# Patient Record
Sex: Female | Born: 1940 | Race: White | Hispanic: No | Smoking: Never smoker
Health system: Southern US, Community
[De-identification: ages and names within clinical notes are randomized; demographics above are authoritative.]

## PROBLEM LIST (undated history)

## (undated) DIAGNOSIS — E119 Type 2 diabetes mellitus without complications: Secondary | ICD-10-CM

## (undated) DIAGNOSIS — E785 Hyperlipidemia, unspecified: Secondary | ICD-10-CM

## (undated) DIAGNOSIS — E079 Disorder of thyroid, unspecified: Secondary | ICD-10-CM

## (undated) DIAGNOSIS — K219 Gastro-esophageal reflux disease without esophagitis: Secondary | ICD-10-CM

## (undated) DIAGNOSIS — I10 Essential (primary) hypertension: Secondary | ICD-10-CM

## (undated) HISTORY — PX: BRAIN SURGERY: SHX531

## (undated) HISTORY — PX: THYROIDECTOMY: SHX17

## (undated) HISTORY — PX: CHOLECYSTECTOMY: SHX55

## (undated) HISTORY — PX: ABDOMINAL HYSTERECTOMY: SHX81

---

## 2014-07-03 ENCOUNTER — Emergency Department
Admit: 2014-07-03 | Disposition: A | Discharge status: Home / Self Care | Payer: Self-pay | Admitting: Emergency Medicine

## 2014-07-03 LAB — BASIC METABOLIC PANEL
ANION GAP: 11 (ref 7–16)
BUN: 19 mg/dL
Calcium, Total: 8.6 mg/dL — ABNORMAL LOW
Chloride: 101 mmol/L
Co2: 21 mmol/L — ABNORMAL LOW
Creatinine: 1.14 mg/dL — ABNORMAL HIGH
EGFR (African American): 56 — ABNORMAL LOW
GFR CALC NON AF AMER: 48 — AB
GLUCOSE: 234 mg/dL — AB
Potassium: 3.7 mmol/L
Sodium: 133 mmol/L — ABNORMAL LOW

## 2014-07-03 LAB — CBC
HCT: 32.9 % — AB (ref 35.0–47.0)
HGB: 10.3 g/dL — ABNORMAL LOW (ref 12.0–16.0)
MCH: 23.3 pg — AB (ref 26.0–34.0)
MCHC: 31.2 g/dL — ABNORMAL LOW (ref 32.0–36.0)
MCV: 74 fL — ABNORMAL LOW (ref 80–100)
Platelet: 196 10*3/uL (ref 150–440)
RBC: 4.42 10*6/uL (ref 3.80–5.20)
RDW: 16.5 % — ABNORMAL HIGH (ref 11.5–14.5)
WBC: 13.6 10*3/uL — ABNORMAL HIGH (ref 3.6–11.0)

## 2014-07-03 LAB — TROPONIN I: Troponin-I: <0.03 ng/mL

## 2016-08-15 ENCOUNTER — Emergency Department
Admission: EM | Admit: 2016-08-15 | Discharge: 2016-08-15 | Disposition: A | Discharge status: Home / Self Care | Payer: Medicare Other | Attending: Emergency Medicine | Admitting: Emergency Medicine

## 2016-08-15 ENCOUNTER — Emergency Department: Payer: Medicare Other

## 2016-08-15 DIAGNOSIS — Y929 Unspecified place or not applicable: Secondary | ICD-10-CM | POA: Diagnosis not present

## 2016-08-15 DIAGNOSIS — X501XXA Overexertion from prolonged static or awkward postures, initial encounter: Secondary | ICD-10-CM | POA: Diagnosis not present

## 2016-08-15 DIAGNOSIS — M25562 Pain in left knee: Secondary | ICD-10-CM | POA: Insufficient documentation

## 2016-08-15 DIAGNOSIS — Y999 Unspecified external cause status: Secondary | ICD-10-CM | POA: Diagnosis not present

## 2016-08-15 DIAGNOSIS — S8992XA Unspecified injury of left lower leg, initial encounter: Secondary | ICD-10-CM | POA: Diagnosis present

## 2016-08-15 DIAGNOSIS — Y9389 Activity, other specified: Secondary | ICD-10-CM | POA: Diagnosis not present

## 2016-08-15 MED ORDER — MELOXICAM 7.5 MG PO TABS
7.5000 mg | ORAL_TABLET | Freq: Every day | ORAL | 1 refills | Status: AC
Start: 2016-08-15 — End: 2016-08-22

## 2016-08-15 MED ORDER — MELOXICAM 7.5 MG PO TABS
7.5000 mg | ORAL_TABLET | Freq: Every day | ORAL | Status: DC
  Administered 2016-08-15: 7.5 mg via ORAL
  Filled 2016-08-15: qty 1

## 2016-08-15 NOTE — ED Notes (Signed)
Left knee swollen medial. Pain 10/10. +2 pulses.

## 2016-08-15 NOTE — ED Provider Notes (Signed)
[facility] Emergency Department Provider Note  ____________________________________________  Time seen: Approximately 5:35 PM  I have reviewed the triage vital signs and the nursing notes.   HISTORY  Chief Complaint Knee Pain    HPI Kaitlyn Lewis is a 76 y.o. female presenting to the emergency department with 10 out of 10 popping left knee pain. Patient states that she was attempting to get in her car yesterday when her left knee twisted and she heard a "pop". Patient denies a history of chronic left knee pain. No falls occurred during the incident. Patient states that since the incident, her left knee has felt unstable. Patient denies radiculopathy and weakness. No prior surgeries to the left knee. No alleviating measures have been attempted.   No past medical history on file.  There are no active problems to display for this patient.   No past surgical history on file.  Prior to Admission medications   Medication Sig Start Date End Date Taking? Authorizing Provider  meloxicam (MOBIC) 7.5 MG tablet Take 1 tablet (7.5 mg total) by mouth daily. 08/15/16 08/22/16  Orvil FeilWoods, Jaclyn M, PA-C    Allergies Patient has no known allergies.  No family history on file.  Social History Social History  Substance Use Topics  . Smoking status: Not on file  . Smokeless tobacco: Not on file  . Alcohol use Not on file     Review of Systems  Constitutional: No fever/chills Eyes: No visual changes. No discharge ENT: No upper respiratory complaints. Cardiovascular: no chest pain. Respiratory: no cough. No SOB. Musculoskeletal: Patient has left knee pain.  Skin: Patient has mild edema of left knee. Neurological: Negative for headaches, focal weakness or numbness.   ____________________________________________   PHYSICAL EXAM:  VITAL SIGNS: ED Triage Vitals  Enc Vitals Group     BP 08/15/16 1700 (!) 156/65     Pulse Rate 08/15/16 1700 100     Resp  08/15/16 1700 18     Temp 08/15/16 1700 98.3 F (36.8 C)     Temp Source 08/15/16 1700 Oral     SpO2 08/15/16 1700 96 %     Weight 08/15/16 1658 165 lb (74.8 kg)     Height 08/15/16 1658 5\' 2"  (1.575 m)     Head Circumference --      Peak Flow --      Pain Score 08/15/16 1658 10     Pain Loc --      Pain Edu? --      Excl. in GC? --      Constitutional: Alert and oriented. Well appearing and in no acute distress. Eyes: Conjunctivae are normal. PERRL. EOMI. Head: Atraumatic. Cardiovascular: Normal rate, regular rhythm. Normal S1 and S2.  Good peripheral circulation. Respiratory: Normal respiratory effort without tachypnea or retractions. Lungs CTAB. Good air entry to the bases with no decreased or absent breath sounds. Musculoskeletal: Patient has 5 out of 5 strength in lower extremities bilaterally. Left knee: Negative anterior and posterior drawer test. No laxity with MCL or LCL testing. Patient has pain with palpation over the medial joint line. Positive McMurray's. Palpable dorsalis pedis pulse bilaterally and symmetrically. Neurologic:  Normal speech and language. No gross focal neurologic deficits are appreciated.  Skin: Patient has mild edema of the skin overlying the left knee. Psychiatric: Mood and affect are normal. Speech and behavior are normal. Patient exhibits appropriate insight and judgement.   ____________________________________________   LABS (all labs ordered are listed, but only abnormal  results are displayed)  Labs Reviewed - No data to display ____________________________________________  EKG   ____________________________________________  RADIOLOGY Geraldo Pitter, personally viewed and evaluated these images (plain radiographs) as part of my medical decision making, as well as reviewing the written report by the radiologist.  DG Left Knee: Negative left knee radiographs   No results  found.  ____________________________________________    PROCEDURES  Procedure(s) performed:    Procedures    Medications  meloxicam (MOBIC) tablet 7.5 mg (7.5 mg Oral Given 08/15/16 1750)     ____________________________________________   INITIAL IMPRESSION / ASSESSMENT AND PLAN / ED COURSE  Pertinent labs & imaging results that were available during my care of the patient were reviewed by me and considered in my medical decision making (see chart for details).  Review of the [place] CSRS was performed in accordance of the [facility] prior to dispensing any controlled drugs.     Assessment and Plan:  Left Knee Pain: Patient presents to the emergency department with left knee pain after a twisting injury while getting into a car. DG left knee reveals no acute fractures or bony abnormalities. Patient was given Mobic in the emergency department and discharged with Mobic. A referral was given to the orthopedics, Dr. Martha Clan. Vital signs were reassuring prior to discharge. All patient questions were answered.  ____________________________________________  FINAL CLINICAL IMPRESSION(S) / ED DIAGNOSES  Final diagnoses:  Acute pain of left knee      NEW MEDICATIONS STARTED DURING THIS VISIT:  New Prescriptions   MELOXICAM (MOBIC) 7.5 MG TABLET    Take 1 tablet (7.5 mg total) by mouth daily.        This chart was dictated using voice recognition software/Dragon. Despite best efforts to proofread, errors can occur which can change the meaning. Any change was purely unintentional.    Gasper Lloyd 08/15/16 Caro Hight, MD 08/15/16 2116

## 2016-08-15 NOTE — ED Triage Notes (Signed)
Pt reports yesterday she was getting into her car and twisted her left knee.

## 2017-07-11 ENCOUNTER — Emergency Department: Payer: Medicare Other

## 2017-07-11 ENCOUNTER — Inpatient Hospital Stay
Admission: EM | Admit: 2017-07-11 | Discharge: 2017-07-14 | DRG: 395 | Disposition: A | Discharge status: Home / Self Care | Payer: Medicare Other | Attending: Specialist | Admitting: Specialist

## 2017-07-11 ENCOUNTER — Other Ambulatory Visit: Payer: Self-pay

## 2017-07-11 DIAGNOSIS — N289 Disorder of kidney and ureter, unspecified: Secondary | ICD-10-CM | POA: Diagnosis not present

## 2017-07-11 DIAGNOSIS — Z79899 Other long term (current) drug therapy: Secondary | ICD-10-CM

## 2017-07-11 DIAGNOSIS — K219 Gastro-esophageal reflux disease without esophagitis: Secondary | ICD-10-CM | POA: Diagnosis not present

## 2017-07-11 DIAGNOSIS — E119 Type 2 diabetes mellitus without complications: Secondary | ICD-10-CM

## 2017-07-11 DIAGNOSIS — K921 Melena: Secondary | ICD-10-CM | POA: Diagnosis not present

## 2017-07-11 DIAGNOSIS — E785 Hyperlipidemia, unspecified: Secondary | ICD-10-CM | POA: Diagnosis not present

## 2017-07-11 DIAGNOSIS — I10 Essential (primary) hypertension: Secondary | ICD-10-CM | POA: Diagnosis present

## 2017-07-11 DIAGNOSIS — F329 Major depressive disorder, single episode, unspecified: Secondary | ICD-10-CM | POA: Diagnosis present

## 2017-07-11 DIAGNOSIS — Z9049 Acquired absence of other specified parts of digestive tract: Secondary | ICD-10-CM

## 2017-07-11 DIAGNOSIS — E890 Postprocedural hypothyroidism: Secondary | ICD-10-CM | POA: Diagnosis present

## 2017-07-11 DIAGNOSIS — K5730 Diverticulosis of large intestine without perforation or abscess without bleeding: Secondary | ICD-10-CM | POA: Diagnosis present

## 2017-07-11 DIAGNOSIS — K449 Diaphragmatic hernia without obstruction or gangrene: Secondary | ICD-10-CM | POA: Diagnosis present

## 2017-07-11 DIAGNOSIS — Z90710 Acquired absence of both cervix and uterus: Secondary | ICD-10-CM

## 2017-07-11 DIAGNOSIS — Z79890 Hormone replacement therapy (postmenopausal): Secondary | ICD-10-CM | POA: Diagnosis not present

## 2017-07-11 DIAGNOSIS — Z8249 Family history of ischemic heart disease and other diseases of the circulatory system: Secondary | ICD-10-CM

## 2017-07-11 DIAGNOSIS — Z7982 Long term (current) use of aspirin: Secondary | ICD-10-CM | POA: Diagnosis not present

## 2017-07-11 DIAGNOSIS — Z7984 Long term (current) use of oral hypoglycemic drugs: Secondary | ICD-10-CM | POA: Diagnosis not present

## 2017-07-11 DIAGNOSIS — E1140 Type 2 diabetes mellitus with diabetic neuropathy, unspecified: Secondary | ICD-10-CM | POA: Diagnosis not present

## 2017-07-11 DIAGNOSIS — D500 Iron deficiency anemia secondary to blood loss (chronic): Secondary | ICD-10-CM | POA: Diagnosis present

## 2017-07-11 DIAGNOSIS — Z794 Long term (current) use of insulin: Secondary | ICD-10-CM

## 2017-07-11 DIAGNOSIS — K55039 Acute (reversible) ischemia of large intestine, extent unspecified: Secondary | ICD-10-CM | POA: Diagnosis present

## 2017-07-11 DIAGNOSIS — K529 Noninfective gastroenteritis and colitis, unspecified: Secondary | ICD-10-CM | POA: Diagnosis present

## 2017-07-11 DIAGNOSIS — K5732 Diverticulitis of large intestine without perforation or abscess without bleeding: Secondary | ICD-10-CM | POA: Diagnosis not present

## 2017-07-11 HISTORY — DX: Gastro-esophageal reflux disease without esophagitis: K21.9

## 2017-07-11 HISTORY — DX: Essential (primary) hypertension: I10

## 2017-07-11 HISTORY — DX: Disorder of thyroid, unspecified: E07.9

## 2017-07-11 HISTORY — DX: Hyperlipidemia, unspecified: E78.5

## 2017-07-11 HISTORY — DX: Type 2 diabetes mellitus without complications: E11.9

## 2017-07-11 LAB — GASTROINTESTINAL PANEL BY PCR, STOOL (REPLACES STOOL CULTURE)
Adenovirus F40/41: NOT DETECTED
Astrovirus: NOT DETECTED
CAMPYLOBACTER SPECIES: NOT DETECTED
CRYPTOSPORIDIUM: NOT DETECTED
CYCLOSPORA CAYETANENSIS: NOT DETECTED
ENTEROAGGREGATIVE E COLI (EAEC): NOT DETECTED
Entamoeba histolytica: NOT DETECTED
Enteropathogenic E coli (EPEC): NOT DETECTED
Enterotoxigenic E coli (ETEC): NOT DETECTED
GIARDIA LAMBLIA: NOT DETECTED
Norovirus GI/GII: NOT DETECTED
Plesimonas shigelloides: NOT DETECTED
Rotavirus A: NOT DETECTED
Salmonella species: NOT DETECTED
Sapovirus (I, II, IV, and V): NOT DETECTED
Shiga like toxin producing E coli (STEC): NOT DETECTED
Shigella/Enteroinvasive E coli (EIEC): NOT DETECTED
VIBRIO SPECIES: NOT DETECTED
Vibrio cholerae: NOT DETECTED
YERSINIA ENTEROCOLITICA: NOT DETECTED

## 2017-07-11 LAB — URINALYSIS, COMPLETE (UACMP) WITH MICROSCOPIC
Bacteria, UA: NONE SEEN
Bilirubin Urine: NEGATIVE
Glucose, UA: 150 mg/dL — AB
Hgb urine dipstick: NEGATIVE
Ketones, ur: NEGATIVE mg/dL
Leukocytes, UA: NEGATIVE
Nitrite: NEGATIVE
Protein, ur: 30 mg/dL — AB
SPECIFIC GRAVITY, URINE: 1.017 (ref 1.005–1.030)
pH: 5 (ref 5.0–8.0)

## 2017-07-11 LAB — COMPREHENSIVE METABOLIC PANEL
ALK PHOS: 92 U/L (ref 38–126)
ALT: 19 U/L (ref 14–54)
AST: 35 U/L (ref 15–41)
Albumin: 3.9 g/dL (ref 3.5–5.0)
Anion gap: 10 (ref 5–15)
BUN: 29 mg/dL — ABNORMAL HIGH (ref 6–20)
CALCIUM: 8.7 mg/dL — AB (ref 8.9–10.3)
CO2: 24 mmol/L (ref 22–32)
CREATININE: 1.29 mg/dL — AB (ref 0.44–1.00)
Chloride: 97 mmol/L — ABNORMAL LOW (ref 101–111)
GFR calc non Af Amer: 39 mL/min — ABNORMAL LOW (ref >60)
GFR, EST AFRICAN AMERICAN: 46 mL/min — AB (ref >60)
Glucose, Bld: 240 mg/dL — ABNORMAL HIGH (ref 65–99)
Potassium: 4 mmol/L (ref 3.5–5.1)
Sodium: 131 mmol/L — ABNORMAL LOW (ref 135–145)
Total Bilirubin: 0.9 mg/dL (ref 0.3–1.2)
Total Protein: 7.3 g/dL (ref 6.5–8.1)

## 2017-07-11 LAB — CBC WITH DIFFERENTIAL/PLATELET
Basophils Absolute: 0 10*3/uL (ref 0–0.1)
Basophils Relative: 0 %
EOS ABS: 0 10*3/uL (ref 0–0.7)
EOS PCT: 0 %
HCT: 33.1 % — ABNORMAL LOW (ref 35.0–47.0)
Hemoglobin: 10.8 g/dL — ABNORMAL LOW (ref 12.0–16.0)
LYMPHS ABS: 1.1 10*3/uL (ref 1.0–3.6)
Lymphocytes Relative: 9 %
MCH: 25.5 pg — AB (ref 26.0–34.0)
MCHC: 32.7 g/dL (ref 32.0–36.0)
MCV: 77.8 fL — AB (ref 80.0–100.0)
Monocytes Absolute: 1 10*3/uL — ABNORMAL HIGH (ref 0.2–0.9)
Monocytes Relative: 8 %
Neutro Abs: 10.4 10*3/uL — ABNORMAL HIGH (ref 1.4–6.5)
Neutrophils Relative %: 83 %
PLATELETS: 214 10*3/uL (ref 150–440)
RBC: 4.26 MIL/uL (ref 3.80–5.20)
RDW: 17.1 % — ABNORMAL HIGH (ref 11.5–14.5)
WBC: 12.5 10*3/uL — ABNORMAL HIGH (ref 3.6–11.0)

## 2017-07-11 LAB — HEMOGLOBIN A1C
HEMOGLOBIN A1C: 7.8 % — AB (ref 4.8–5.6)
MEAN PLASMA GLUCOSE: 177.16 mg/dL

## 2017-07-11 LAB — TYPE AND SCREEN
ABO/RH(D): A NEG
Antibody Screen: NEGATIVE

## 2017-07-11 LAB — PROTIME-INR
INR: 1.08
Prothrombin Time: 13.9 seconds (ref 11.4–15.2)

## 2017-07-11 LAB — GLUCOSE, CAPILLARY
GLUCOSE-CAPILLARY: 267 mg/dL — AB (ref 65–99)
Glucose-Capillary: 180 mg/dL — ABNORMAL HIGH (ref 65–99)
Glucose-Capillary: 200 mg/dL — ABNORMAL HIGH (ref 65–99)

## 2017-07-11 LAB — HEMOGLOBIN
HEMOGLOBIN: 10.3 g/dL — AB (ref 12.0–16.0)
Hemoglobin: 10.5 g/dL — ABNORMAL LOW (ref 12.0–16.0)

## 2017-07-11 LAB — C DIFFICILE QUICK SCREEN W PCR REFLEX
C DIFFICILE (CDIFF) INTERP: NOT DETECTED
C DIFFICLE (CDIFF) ANTIGEN: NEGATIVE
C Diff toxin: NEGATIVE

## 2017-07-11 LAB — TROPONIN I

## 2017-07-11 LAB — LIPASE, BLOOD: Lipase: 25 U/L (ref 11–51)

## 2017-07-11 LAB — LACTIC ACID, PLASMA: Lactic Acid, Venous: 2.1 mmol/L (ref 0.5–1.9)

## 2017-07-11 MED ORDER — METRONIDAZOLE IN NACL 5-0.79 MG/ML-% IV SOLN
500.0000 mg | Freq: Once | INTRAVENOUS | Status: AC
  Administered 2017-07-11: 500 mg via INTRAVENOUS
  Filled 2017-07-11: qty 100

## 2017-07-11 MED ORDER — SODIUM CHLORIDE 0.9 % IV SOLN: Freq: Once | INTRAVENOUS | Status: DC

## 2017-07-11 MED ORDER — METRONIDAZOLE IN NACL 5-0.79 MG/ML-% IV SOLN
500.0000 mg | Freq: Three times a day (TID) | INTRAVENOUS | Status: DC
  Administered 2017-07-11 – 2017-07-14 (×8): 500 mg via INTRAVENOUS
  Filled 2017-07-11 (×12): qty 100

## 2017-07-11 MED ORDER — LEVOTHYROXINE SODIUM 88 MCG PO TABS
88.0000 ug | ORAL_TABLET | Freq: Every day | ORAL | Status: DC
  Administered 2017-07-12 – 2017-07-14 (×3): 88 ug via ORAL
  Filled 2017-07-11 (×3): qty 1

## 2017-07-11 MED ORDER — MORPHINE SULFATE (PF) 2 MG/ML IV SOLN
2.0000 mg | INTRAVENOUS | Status: DC | PRN
  Administered 2017-07-11 – 2017-07-13 (×7): 2 mg via INTRAVENOUS
  Filled 2017-07-11 (×7): qty 1

## 2017-07-11 MED ORDER — ACETAMINOPHEN 650 MG RE SUPP: 650.0000 mg | Freq: Four times a day (QID) | RECTAL | Status: DC | PRN

## 2017-07-11 MED ORDER — GABAPENTIN 300 MG PO CAPS
300.0000 mg | ORAL_CAPSULE | Freq: Three times a day (TID) | ORAL | Status: DC
  Administered 2017-07-11 – 2017-07-14 (×9): 300 mg via ORAL
  Filled 2017-07-11 (×10): qty 1

## 2017-07-11 MED ORDER — SODIUM CHLORIDE 0.9 % IV SOLN
Freq: Once | INTRAVENOUS | Status: AC
  Administered 2017-07-11: 09:00:00 via INTRAVENOUS

## 2017-07-11 MED ORDER — ONDANSETRON HCL 4 MG/2ML IJ SOLN
4.0000 mg | Freq: Four times a day (QID) | INTRAMUSCULAR | Status: DC | PRN
  Administered 2017-07-12 – 2017-07-13 (×2): 4 mg via INTRAVENOUS
  Filled 2017-07-11 (×2): qty 2

## 2017-07-11 MED ORDER — AMITRIPTYLINE HCL 50 MG PO TABS
50.0000 mg | ORAL_TABLET | Freq: Every day | ORAL | Status: DC
  Administered 2017-07-11 – 2017-07-13 (×3): 50 mg via ORAL
  Filled 2017-07-11 (×4): qty 1

## 2017-07-11 MED ORDER — PROPRANOLOL HCL 20 MG PO TABS
20.0000 mg | ORAL_TABLET | Freq: Two times a day (BID) | ORAL | Status: DC
  Filled 2017-07-11: qty 1

## 2017-07-11 MED ORDER — MORPHINE SULFATE (PF) 4 MG/ML IV SOLN
4.0000 mg | Freq: Once | INTRAVENOUS | Status: AC
  Administered 2017-07-11: 4 mg via INTRAVENOUS
  Filled 2017-07-11: qty 1

## 2017-07-11 MED ORDER — CIPROFLOXACIN IN D5W 400 MG/200ML IV SOLN
400.0000 mg | Freq: Once | INTRAVENOUS | Status: AC
  Administered 2017-07-11: 400 mg via INTRAVENOUS
  Filled 2017-07-11: qty 200

## 2017-07-11 MED ORDER — INSULIN GLARGINE 100 UNIT/ML ~~LOC~~ SOLN
20.0000 [IU] | Freq: Two times a day (BID) | SUBCUTANEOUS | Status: DC
  Administered 2017-07-11 – 2017-07-13 (×5): 20 [IU] via SUBCUTANEOUS
  Filled 2017-07-11 (×8): qty 0.2

## 2017-07-11 MED ORDER — INSULIN ASPART 100 UNIT/ML ~~LOC~~ SOLN
0.0000 [IU] | Freq: Every day | SUBCUTANEOUS | Status: DC
  Administered 2017-07-11: 3 [IU] via SUBCUTANEOUS
  Filled 2017-07-11: qty 1

## 2017-07-11 MED ORDER — PANTOPRAZOLE SODIUM 40 MG PO TBEC
40.0000 mg | DELAYED_RELEASE_TABLET | Freq: Every day | ORAL | Status: DC
  Administered 2017-07-11 – 2017-07-14 (×4): 40 mg via ORAL
  Filled 2017-07-11 (×4): qty 1

## 2017-07-11 MED ORDER — MORPHINE SULFATE (PF) 4 MG/ML IV SOLN
INTRAVENOUS | Status: AC
  Filled 2017-07-11: qty 1

## 2017-07-11 MED ORDER — CIPROFLOXACIN IN D5W 400 MG/200ML IV SOLN
400.0000 mg | Freq: Two times a day (BID) | INTRAVENOUS | Status: DC
  Administered 2017-07-11 – 2017-07-14 (×6): 400 mg via INTRAVENOUS
  Filled 2017-07-11 (×8): qty 200

## 2017-07-11 MED ORDER — TRAMADOL HCL 50 MG PO TABS
50.0000 mg | ORAL_TABLET | Freq: Four times a day (QID) | ORAL | Status: DC | PRN
  Administered 2017-07-11 – 2017-07-12 (×4): 50 mg via ORAL
  Filled 2017-07-11 (×5): qty 1

## 2017-07-11 MED ORDER — ACETAMINOPHEN 325 MG PO TABS: 650.0000 mg | ORAL_TABLET | Freq: Four times a day (QID) | ORAL | Status: DC | PRN

## 2017-07-11 MED ORDER — ATORVASTATIN CALCIUM 20 MG PO TABS
40.0000 mg | ORAL_TABLET | Freq: Every day | ORAL | Status: DC
  Administered 2017-07-11 – 2017-07-14 (×4): 40 mg via ORAL
  Filled 2017-07-11 (×4): qty 2

## 2017-07-11 MED ORDER — SODIUM CHLORIDE 0.9 % IV SOLN
INTRAVENOUS | Status: DC
  Administered 2017-07-11 – 2017-07-13 (×2): via INTRAVENOUS

## 2017-07-11 MED ORDER — INSULIN ASPART 100 UNIT/ML ~~LOC~~ SOLN
0.0000 [IU] | Freq: Three times a day (TID) | SUBCUTANEOUS | Status: DC
  Administered 2017-07-11 – 2017-07-12 (×2): 2 [IU] via SUBCUTANEOUS
  Administered 2017-07-12 (×2): 3 [IU] via SUBCUTANEOUS
  Administered 2017-07-13 (×2): 1 [IU] via SUBCUTANEOUS
  Filled 2017-07-11 (×6): qty 1

## 2017-07-11 MED ORDER — MORPHINE SULFATE (PF) 4 MG/ML IV SOLN
4.0000 mg | Freq: Once | INTRAVENOUS | Status: AC
  Administered 2017-07-11: 4 mg via INTRAVENOUS

## 2017-07-11 MED ORDER — VENLAFAXINE HCL ER 75 MG PO CP24
75.0000 mg | ORAL_CAPSULE | Freq: Every day | ORAL | Status: DC
  Administered 2017-07-12 – 2017-07-14 (×3): 75 mg via ORAL
  Filled 2017-07-11 (×3): qty 1

## 2017-07-11 MED ORDER — PROPRANOLOL HCL 20 MG PO TABS
20.0000 mg | ORAL_TABLET | Freq: Two times a day (BID) | ORAL | Status: DC
  Administered 2017-07-11 – 2017-07-14 (×6): 20 mg via ORAL
  Filled 2017-07-11 (×8): qty 1

## 2017-07-11 MED ORDER — ONDANSETRON HCL 4 MG/2ML IJ SOLN
4.0000 mg | Freq: Once | INTRAMUSCULAR | Status: AC
  Administered 2017-07-11: 4 mg via INTRAVENOUS
  Filled 2017-07-11: qty 2

## 2017-07-11 MED ORDER — IOPAMIDOL (ISOVUE-300) INJECTION 61%
100.0000 mL | Freq: Once | INTRAVENOUS | Status: AC | PRN
  Administered 2017-07-11: 75 mL via INTRAVENOUS

## 2017-07-11 MED ORDER — ONDANSETRON HCL 4 MG PO TABS: 4.0000 mg | ORAL_TABLET | Freq: Four times a day (QID) | ORAL | Status: DC | PRN

## 2017-07-11 NOTE — ED Notes (Signed)
Pt unable to give urine or stool specimen at this time. Attempted x2.

## 2017-07-11 NOTE — ED Notes (Signed)
Pt assisted to toilet to void 

## 2017-07-11 NOTE — ED Triage Notes (Signed)
Pt c/o having nothing but blooding stools yesterday and today, x3 today with abd pain. N/v last night

## 2017-07-11 NOTE — ED Notes (Signed)
Urine specimen obtained and sent to lab. No stool sample at this time.

## 2017-07-11 NOTE — Consult Note (Signed)
Pharmacy Antibiotic Note  Kaitlyn PomfretMary Janet Lewis is a 77 y.o. female admitted on 07/11/2017 with colitis.  Pharmacy has been consulted for ciprofloxacin dosing.  Plan: Cipro 400mg  IV q 12hr  Height: 5\' 5"  (165.1 cm) Weight: 174 lb (78.9 kg) IBW/kg (Calculated) : 57  Temp (24hrs), Avg:98.9 F (37.2 C), Min:98.9 F (37.2 C), Max:98.9 F (37.2 C)  Recent Labs  Lab 07/11/17 0911 07/11/17 1142  WBC 12.5*  --   CREATININE 1.29*  --   LATICACIDVEN  --  2.1*    Estimated Creatinine Clearance: 39.1 mL/min (A) (by C-G formula based on SCr of 1.29 mg/dL (H)).    No Known Allergies  Antimicrobials this admission: cipro 4/18 >>  metronidazole 4/18 >>   Dose adjustments this admission:   Microbiology results: 4/18 GI panel: 4/18 Cdiff :  Thank you for allowing pharmacy to be a part of this patient's care.  Olene FlossMelissa D Elbridge Magowan, Pharm.D, BCPS Clinical Pharmacist  07/11/2017 1:43 PM

## 2017-07-11 NOTE — ED Provider Notes (Signed)
[facility] Emergency Department Provider Note       Time seen: ----------------------------------------- 8:52 AM on 07/11/2017 -----------------------------------------   I have reviewed the triage vital signs and the nursing notes.  HISTORY   Chief Complaint Rectal Bleeding    HPI Kaitlyn Lewis is a 77 y.o. female with a history of diabetes, GERD, hyperlipidemia and hypertension who presents to the ED for rectal bleeding yesterday and today.  She has had 3 episodes today with abdominal pain, nausea vomiting last night.  Patient has had frank watery stools as well as bright blood per rectum.  She has had some lower abdominal pain as well, she has never had these symptoms before.  Again she has had episodes of vomiting as dictated above.  Past Medical History:  Diagnosis Date  . Diabetes mellitus without complication (HCC)   . GERD (gastroesophageal reflux disease)   . Hyperlipemia   . Hypertension   . Thyroid disease     There are no active problems to display for this patient.   Past Surgical History:  Procedure Laterality Date  . ABDOMINAL HYSTERECTOMY    . BRAIN SURGERY     benign tumor removal  . CHOLECYSTECTOMY    . THYROIDECTOMY      Allergies Patient has no known allergies.  Social History Social History   Tobacco Use  . Smoking status: Never Smoker  . Smokeless tobacco: Never Used  Substance Use Topics  . Alcohol use: Never    Frequency: Never  . Drug use: Not on file   Review of Systems Constitutional: Negative for fever. Cardiovascular: Negative for chest pain. Respiratory: Negative for shortness of breath. Gastrointestinal: Positive for abdominal pain, vomiting and diarrhea, rectal bleeding Genitourinary: Negative for dysuria. Musculoskeletal: Negative for back pain. Skin: Negative for rash. Neurological: Negative for headaches, focal weakness or numbness.  All systems negative/normal/unremarkable except as  stated in the HPI  ____________________________________________   PHYSICAL EXAM:  VITAL SIGNS: ED Triage Vitals  Enc Vitals Group     BP 07/11/17 0847 (!) 176/90     Pulse Rate 07/11/17 0847 (!) 116     Resp 07/11/17 0847 18     Temp 07/11/17 0847 98.9 F (37.2 C)     Temp Source 07/11/17 0847 Oral     SpO2 07/11/17 0847 97 %     Weight 07/11/17 0848 174 lb (78.9 kg)     Height 07/11/17 0848 5\' 5"  (1.651 m)     Head Circumference --      Peak Flow --      Pain Score 07/11/17 0848 10     Pain Loc --      Pain Edu? --      Excl. in GC? --    Constitutional: Alert and oriented. Well appearing and in no distress. Eyes: Conjunctivae are normal. Normal extraocular movements. ENT   Head: Normocephalic and atraumatic.   Nose: No congestion/rhinnorhea.   Mouth/Throat: Mucous membranes are moist.   Neck: No stridor. Cardiovascular: Rapid rate, regular rhythm. No murmurs, rubs, or gallops. Respiratory: Normal respiratory effort without tachypnea nor retractions. Breath sounds are clear and equal bilaterally. No wheezes/rales/rhonchi. Gastrointestinal: Nonfocal tenderness, no rebound or guarding.  Normal bowel sounds. Musculoskeletal: Nontender with normal range of motion in extremities. No lower extremity tenderness nor edema. Neurologic:  Normal speech and language. No gross focal neurologic deficits are appreciated.  Skin:  Skin is warm, dry and intact. No rash noted. Psychiatric: Mood and affect are normal. Speech  and behavior are normal.  ____________________________________________  ED COURSE:  As part of my medical decision making, I reviewed the following data within the electronic MEDICAL RECORD NUMBER History obtained from family if available, nursing notes, old chart and ekg, as well as notes from prior ED visits. Patient presented for rectal bleeding, vomiting diarrhea, we will assess with labs and imaging as indicated at this time.    Procedures ____________________________________________   LABS (pertinent positives/negatives)  Labs Reviewed  CBC WITH DIFFERENTIAL/PLATELET - Abnormal; Notable for the following components:      Result Value   WBC 12.5 (*)    Hemoglobin 10.8 (*)    HCT 33.1 (*)    MCV 77.8 (*)    MCH 25.5 (*)    RDW 17.1 (*)    Neutro Abs 10.4 (*)    Monocytes Absolute 1.0 (*)    All other components within normal limits  COMPREHENSIVE METABOLIC PANEL - Abnormal; Notable for the following components:   Sodium 131 (*)    Chloride 97 (*)    Glucose, Bld 240 (*)    BUN 29 (*)    Creatinine, Ser 1.29 (*)    Calcium 8.7 (*)    GFR calc non Af Amer 39 (*)    GFR calc Af Amer 46 (*)    All other components within normal limits  URINALYSIS, COMPLETE (UACMP) WITH MICROSCOPIC - Abnormal; Notable for the following components:   Color, Urine YELLOW (*)    APPearance CLEAR (*)    Glucose, UA 150 (*)    Protein, ur 30 (*)    Squamous Epithelial / LPF 0-5 (*)    All other components within normal limits  C DIFFICILE QUICK SCREEN W PCR REFLEX  GASTROINTESTINAL PANEL BY PCR, STOOL (REPLACES STOOL CULTURE)  LIPASE, BLOOD  TROPONIN I  PROTIME-INR  LACTIC ACID, PLASMA  TYPE AND SCREEN    RADIOLOGY Images were viewed by me CT the abdomen pelvis with contrast IMPRESSION: 1. Moderate wall thickening of the descending and sigmoid colon with mild pericolonic inflammatory stranding, consistent with colitis, likely infectious or inflammatory in etiology. 2.  Aortic atherosclerosis (ICD10-I70.0).   ____________________________________________  DIFFERENTIAL DIAGNOSIS   Colitis, gastroenteritis, diverticulosis, diverticulitis, dehydration, electrolyte abnormality, anemia  FINAL ASSESSMENT AND PLAN  Rectal bleeding, vomiting and diarrhea   Plan: The patient had presented for vomiting as well as diarrhea with bleeding. Patient's labs were stable compared to her prior labs. Patient's imaging  did reveal colitis of uncertain etiology.  I will place her on Cipro and Flagyl, I will discuss with the hospitalist for admission.  She has been unable to provide a stool sample here to this point.   Ulice Dash, MD   Note: This note was generated in part or whole with voice recognition software. Voice recognition is usually quite accurate but there are transcription errors that can and very often do occur. I apologize for any typographical errors that were not detected and corrected.     Emily Filbert, MD 07/11/17 [phone]

## 2017-07-11 NOTE — ED Notes (Addendum)
Critical lactic of 2.1 reported to Dr Mayford KnifeWilliams - no new orders at this time

## 2017-07-11 NOTE — ED Notes (Signed)
Pt given graham crackers, peanut butter, and diet sprite 

## 2017-07-11 NOTE — H&P (Signed)
[employer] [facility] [facility] [place] [facility]   PATIENT NAME: Kaitlyn Lewis    MR#:  [id number]  DATE OF BIRTH:  11/30/1940  DATE OF ADMISSION:  07/11/2017  PRIMARY CARE PHYSICIAN: Baltazar Najjar, MD   REQUESTING/REFERRING PHYSICIAN: Dr. Daryel November  CHIEF COMPLAINT:   Chief Complaint  Patient presents with  . Rectal Bleeding    HISTORY OF PRESENT ILLNESS:  Kaitlyn Lewis  is a 77 y.o. female with a known history of insulin-dependent diabetes mellitus, hypertension, hypothyroidism, hyperlipidemia presents from home secondary to abdominal pain and bloody stools. Patient lives at home with her family.  Independent at baseline.  Lately has been having some falls.  Denies any fevers or chills.  Yesterday she was at her brother's funeral.  It was a stressful time for her.  As soon as she returned home, she started having nausea and had one episode of vomiting.  Then her lower abdomen started hurting.  She has been having bloody stools.  Denies eating any allergies to foods.  No other travel.  Is CT of the abdomen here showing sigmoid and descending colitis.  WBC is elevated.  And she is slightly tachycardic  PAST MEDICAL HISTORY:   Past Medical History:  Diagnosis Date  . Diabetes mellitus without complication (HCC)   . GERD (gastroesophageal reflux disease)   . Hyperlipemia   . Hypertension   . Thyroid disease     PAST SURGICAL HISTORY:   Past Surgical History:  Procedure Laterality Date  . ABDOMINAL HYSTERECTOMY    . BRAIN SURGERY     benign tumor removal  . CHOLECYSTECTOMY    . THYROIDECTOMY      SOCIAL HISTORY:   Social History   Tobacco Use  . Smoking status: Never Smoker  . Smokeless tobacco: Never Used  Substance Use Topics  . Alcohol use: Never    Frequency: Never    FAMILY HISTORY:   Family History  Problem Relation Age of Onset  . CAD Brother     DRUG ALLERGIES:  No Known Allergies  REVIEW OF SYSTEMS:   Review of Systems    Constitutional: Positive for malaise/fatigue. Negative for chills, fever and weight loss.  HENT: Negative for ear discharge, ear pain, hearing loss and nosebleeds.   Eyes: Negative for blurred vision, double vision and photophobia.  Respiratory: Negative for cough, hemoptysis, shortness of breath and wheezing.   Cardiovascular: Negative for chest pain, palpitations, orthopnea and leg swelling.  Gastrointestinal: Positive for abdominal pain, blood in stool, diarrhea, nausea and vomiting. Negative for constipation, heartburn and melena.  Genitourinary: Negative for dysuria, frequency, hematuria and urgency.  Musculoskeletal: Positive for falls. Negative for back pain, myalgias and neck pain.  Skin: Negative for rash.  Neurological: Negative for dizziness, tingling, tremors, sensory change, speech change, focal weakness and headaches.  Endo/Heme/Allergies: Does not bruise/bleed easily.  Psychiatric/Behavioral: Negative for depression.    MEDICATIONS AT HOME:   Prior to Admission medications   Not on File      VITAL SIGNS:  Blood pressure 123/61, pulse (!) 105, temperature 98.9 F (37.2 C), temperature source Oral, resp. rate 14, height 5\' 5"  (1.651 m), weight 78.9 kg (174 lb), SpO2 93 %.  PHYSICAL EXAMINATION:   Physical Exam  GENERAL:  77 y.o.-year-old patient lying in the bed with no acute distress.  EYES: Pupils equal, round, reactive to light and accommodation. No scleral icterus. Extraocular muscles intact.  HEENT: Head atraumatic, normocephalic. Oropharynx and nasopharynx clear.  NECK:  Supple, no  jugular venous distention. No thyroid enlargement, no tenderness.  LUNGS: Normal breath sounds bilaterally, no wheezing, rales,rhonchi or crepitation. No use of accessory muscles of respiration.  CARDIOVASCULAR: S1, S2 normal. No murmurs, rubs, or gallops.  ABDOMEN: Soft, but tender in lower abdomen, nondistended. Bowel sounds present. No organomegaly or mass.  EXTREMITIES: No pedal  edema, cyanosis, or clubbing.  NEUROLOGIC: Cranial nerves II through XII are intact. Muscle strength 5/5 in all extremities. Sensation intact. Gait not checked.  PSYCHIATRIC: The patient is alert and oriented x 3.  SKIN: No obvious rash, lesion, or ulcer.   LABORATORY PANEL:   CBC Recent Labs  Lab 07/11/17 0911  WBC 12.5*  HGB 10.8*  HCT 33.1*  PLT 214   ------------------------------------------------------------------------------------------------------------------  Chemistries  Recent Labs  Lab 07/11/17 0911  NA 131*  K 4.0  CL 97*  CO2 24  GLUCOSE 240*  BUN 29*  CREATININE 1.29*  CALCIUM 8.7*  AST 35  ALT 19  ALKPHOS 92  BILITOT 0.9   ------------------------------------------------------------------------------------------------------------------  Cardiac Enzymes Recent Labs  Lab 07/11/17 0911  TROPONINI <0.03   ------------------------------------------------------------------------------------------------------------------  RADIOLOGY:  Ct Abdomen Pelvis W Contrast  Result Date: 07/11/2017 CLINICAL DATA:  Abdominal pain with nausea, vomiting, and hematochezia. EXAM: CT ABDOMEN AND PELVIS WITH CONTRAST TECHNIQUE: Multidetector CT imaging of the abdomen and pelvis was performed using the standard protocol following bolus administration of intravenous contrast. CONTRAST:  75mL ISOVUE-300 IOPAMIDOL (ISOVUE-300) INJECTION 61% COMPARISON:  None. FINDINGS: Lower chest: No acute abnormality. Hepatobiliary: No focal liver abnormality is seen. Status post cholecystectomy. Mild common bile duct dilatation is likely secondary to post cholecystectomy state. Pancreas: Mild atrophy. No ductal dilatation or surrounding inflammatory changes. Spleen: Normal in size without focal abnormality. Adrenals/Urinary Tract: The adrenal glands are unremarkable. Mild bilateral renal cortical atrophy. 1.2 cm exophytic simple cyst arising from the midpole of the right kidney. No renal or  ureteral calculi. No hydronephrosis. The bladder is unremarkable. Stomach/Bowel: There is moderate wall thickening of the descending and sigmoid colon with mild pericolonic inflammatory stranding. The remaining colon is unremarkable. Mild left-sided colonic diverticulosis. Normal appendix. The stomach and small bowel are within normal limits. No bowel obstruction. Vascular/Lymphatic: Minimal aortic atherosclerosis. No enlarged abdominal or pelvic lymph nodes. Reproductive: Status post hysterectomy. No adnexal masses. Other: Small amount of free fluid in the left paracolic gutter. No pneumoperitoneum. Musculoskeletal: No acute or significant osseous findings. Mild degenerative disc disease at L4-L5. IMPRESSION: 1. Moderate wall thickening of the descending and sigmoid colon with mild pericolonic inflammatory stranding, consistent with colitis, likely infectious or inflammatory in etiology. 2.  Aortic atherosclerosis (ICD10-I70.0). Electronically Signed   By: Obie DredgeWilliam T Derry M.D.   On: 07/11/2017 11:50    EKG:  No orders found for this or any previous visit.  IMPRESSION AND PLAN:   Kaitlyn Lewis  is a 77 y.o. female with a known history of insulin-dependent diabetes mellitus, hypertension, hypothyroidism, hyperlipidemia presents from home secondary to abdominal pain and bloody stools.  1. Acute colitis- admit, IV fluids - soft diet - IV cipro and flagyl - monitor H&H if bloody diarrhea- if doesn't improve- call GI consult - if loose stools- order c.diff and GI panel  2. IDDM- her home meds need to be updated - carb modified diet - restart home meds - SSI  3. HTN- restart home meds  4. Mild renal insufficiency- IV fluids  5. DVT Prophylaxis- TEDs and SCDs- no heparin products due to rectal bleeding  Physical Therapy consult for falls  All the records are reviewed and case discussed with ED provider. Management plans discussed with the patient, family and they are in agreement.  CODE  STATUS:  Full Code  TOTAL TIME TAKING CARE OF THIS PATIENT: 50 minutes.    Enid Baas M.D on 07/11/2017 at 12:27 PM  Between 7am to 6pm - Pager - [phone]  After 6pm go to [email or web address] - Social research officer, government  [facility]  Office  [phone]  CC: Primary care physician; Baltazar Najjar, MD

## 2017-07-11 NOTE — ED Notes (Signed)
Patient transported to CT 

## 2017-07-11 NOTE — Evaluation (Signed)
Physical Therapy Evaluation Patient Details Name: Kaitlyn PomfretMary Janet Lewis MRN: [id number] DOB: 10/18/1940 Today's Date: 07/11/2017   History of Present Illness  77 yo female with onset of N/V/D after her brother's funeral, having severe abd pain and (+) colitis on imaging, enteric precautions.  Has recent falls, bloody stools, renal insufficiency, elevated lactic acid.  PMHx:  aortic atherosclerosis, DM, HTN, thyroid disease  Clinical Impression  Pt is demonstrating balance changes in standing despite previous independent status, and is not sure of all the equipment available to her.  Has commented on canes and walkers but will anticipate a RW is needed as she is not sure where all devices are.  Follow acutely for strengthening and anticipate transition to home with HHPT for balance and strengthening to increase safety.      Follow Up Recommendations Home health PT;Supervision for mobility/OOB    Equipment Recommendations  Rolling walker with 5" wheels    Recommendations for Other Services       Precautions / Restrictions Precautions Precautions: Fall Precaution Comments: enteric Restrictions Weight Bearing Restrictions: No      Mobility  Bed Mobility Overal bed mobility: Modified Independent             General bed mobility comments: extra time and use of bedrail  Transfers Overall transfer level: Needs assistance Equipment used: Rolling walker (2 wheeled);1 person hand held assist Transfers: Sit to/from Stand Sit to Stand: Min guard         General transfer comment: monitored due to safety with weakness from fluid loss  Ambulation/Gait Ambulation/Gait assistance: Min guard Ambulation Distance (Feet): 35 Feet Assistive device: Rolling walker (2 wheeled);1 person hand held assist Gait Pattern/deviations: Step-through pattern;Trunk flexed;Wide base of support;Shuffle Gait velocity: reduced Gait velocity interpretation: <1.31 ft/sec, indicative of household  ambulator General Gait Details: wider base with dependence on IV pole for actual gait but stands with fair balance  Stairs            Wheelchair Mobility    Modified Rankin (Stroke Patients Only)       Balance Overall balance assessment: Needs assistance Sitting-balance support: Feet supported Sitting balance-Leahy Scale: Fair     Standing balance support: Bilateral upper extremity supported;During functional activity Standing balance-Leahy Scale: Fair Standing balance comment: less than fair dynamic balance in standing                             Pertinent Vitals/Pain Pain Assessment: Faces Faces Pain Scale: Hurts even more Pain Location: abdomen Pain Descriptors / Indicators: Tender;Tightness Pain Intervention(s): Limited activity within patient's tolerance;Monitored during session;Repositioned    Home Living Family/patient expects to be discharged to:: Private residence Living Arrangements: Children Available Help at Discharge: Family;Available 24 hours/day Type of Home: House Home Access: Stairs to enter   [employer] CorporationEntrance Stairs-Number of Steps: 2 Home Layout: One level Home Equipment: Walker - 2 wheels;Cane - single point Additional Comments: pt was mod I prior to her admission    Prior Function Level of Independence: Independent               Hand Dominance   Dominant Hand: Right    Extremity/Trunk Assessment   Upper Extremity Assessment Upper Extremity Assessment: Overall WFL for tasks assessed    Lower Extremity Assessment Lower Extremity Assessment: Generalized weakness    Cervical / Trunk Assessment Cervical / Trunk Assessment: Kyphotic  Communication   Communication: No difficulties  Cognition Arousal/Alertness: Awake/alert Behavior During Therapy: [facility]  for tasks assessed/performed Overall Cognitive Status: Within Functional Limits for tasks assessed                                        General Comments       Exercises     Assessment/Plan    PT Assessment Patient needs continued PT services  PT Problem List Decreased strength;Decreased range of motion;Decreased activity tolerance;Decreased balance;Decreased mobility;Decreased coordination;Decreased knowledge of use of DME;Decreased safety awareness;Cardiopulmonary status limiting activity;Pain       PT Treatment Interventions DME instruction;Gait training;Stair training;Functional mobility training;Therapeutic activities;Therapeutic exercise;Balance training;Neuromuscular re-education;Patient/family education    PT Goals (Current goals can be found in the Care Plan section)  Acute Rehab PT Goals Patient Stated Goal: to get home and feel better PT Goal Formulation: With patient Time For Goal Achievement: 07/25/17 Potential to Achieve Goals: Good    Frequency Min 2X/week   Barriers to discharge Inaccessible home environment has been home with family to assist her    Co-evaluation               AM-PAC PT "6 Clicks" Daily Activity  Outcome Measure Difficulty turning over in bed (including adjusting bedclothes, sheets and blankets)?: A Little Difficulty moving from lying on back to sitting on the side of the bed? : A Little Difficulty sitting down on and standing up from a chair with arms (e.g., wheelchair, bedside commode, etc,.)?: A Little Help needed moving to and from a bed to chair (including a wheelchair)?: A Little Help needed walking in hospital room?: A Little Help needed climbing 3-5 steps with a railing? : A Little 6 Click Score: 18    End of Session Equipment Utilized During Treatment: Gait belt Activity Tolerance: Patient limited by fatigue;Patient limited by pain(abd pain limits sitting tolerance) Patient left: in bed;in chair;with call bell/phone within reach;with bed alarm set Nurse Communication: Mobility status PT Visit Diagnosis: Unsteadiness on feet (R26.81);Muscle weakness (generalized)  (M62.81);Difficulty in walking, not elsewhere classified (R26.2)    Time: 1610-9604 PT Time Calculation (min) (ACUTE ONLY): 30 min   Charges:   PT Evaluation $PT Eval Moderate Complexity: 1 Mod PT Treatments $Gait Training: 8-22 mins   PT G Codes:   PT G-Codes **NOT FOR INPATIENT CLASS** Functional Assessment Tool Used: AM-PAC 6 Clicks Basic Mobility    Ivar Drape 07/11/2017, 6:34 PM   Samul Dada, PT MS Acute Rehab Dept. Number: [facility] [id number] and [facility] [phone]

## 2017-07-12 DIAGNOSIS — R1030 Lower abdominal pain, unspecified: Secondary | ICD-10-CM | POA: Diagnosis not present

## 2017-07-12 DIAGNOSIS — R933 Abnormal findings on diagnostic imaging of other parts of digestive tract: Secondary | ICD-10-CM | POA: Diagnosis not present

## 2017-07-12 DIAGNOSIS — Z791 Long term (current) use of non-steroidal anti-inflammatories (NSAID): Secondary | ICD-10-CM | POA: Diagnosis not present

## 2017-07-12 DIAGNOSIS — K529 Noninfective gastroenteritis and colitis, unspecified: Secondary | ICD-10-CM | POA: Diagnosis not present

## 2017-07-12 DIAGNOSIS — K3189 Other diseases of stomach and duodenum: Secondary | ICD-10-CM | POA: Diagnosis not present

## 2017-07-12 DIAGNOSIS — D500 Iron deficiency anemia secondary to blood loss (chronic): Secondary | ICD-10-CM | POA: Diagnosis not present

## 2017-07-12 DIAGNOSIS — K55039 Acute (reversible) ischemia of large intestine, extent unspecified: Secondary | ICD-10-CM | POA: Diagnosis not present

## 2017-07-12 DIAGNOSIS — K449 Diaphragmatic hernia without obstruction or gangrene: Secondary | ICD-10-CM | POA: Diagnosis not present

## 2017-07-12 DIAGNOSIS — K633 Ulcer of intestine: Secondary | ICD-10-CM | POA: Diagnosis not present

## 2017-07-12 LAB — HEMOGLOBIN
Hemoglobin: 10.2 g/dL — ABNORMAL LOW (ref 12.0–16.0)
Hemoglobin: 9.5 g/dL — ABNORMAL LOW (ref 12.0–16.0)

## 2017-07-12 LAB — IRON AND TIBC
Iron: 35 ug/dL (ref 28–170)
Saturation Ratios: 10 % — ABNORMAL LOW (ref 10.4–31.8)
TIBC: 336 ug/dL (ref 250–450)
UIBC: 301 ug/dL

## 2017-07-12 LAB — BASIC METABOLIC PANEL WITH GFR
Anion gap: 7 (ref 5–15)
BUN: 14 mg/dL (ref 6–20)
CO2: 25 mmol/L (ref 22–32)
Calcium: 8.3 mg/dL — ABNORMAL LOW (ref 8.9–10.3)
Chloride: 99 mmol/L — ABNORMAL LOW (ref 101–111)
Creatinine, Ser: 1.22 mg/dL — ABNORMAL HIGH (ref 0.44–1.00)
GFR calc Af Amer: 49 mL/min — ABNORMAL LOW
GFR calc non Af Amer: 42 mL/min — ABNORMAL LOW
Glucose, Bld: 224 mg/dL — ABNORMAL HIGH (ref 65–99)
Potassium: 4.2 mmol/L (ref 3.5–5.1)
Sodium: 131 mmol/L — ABNORMAL LOW (ref 135–145)

## 2017-07-12 LAB — CBC
HCT: 31.3 % — ABNORMAL LOW (ref 35.0–47.0)
Hemoglobin: 10.2 g/dL — ABNORMAL LOW (ref 12.0–16.0)
MCH: 25.5 pg — ABNORMAL LOW (ref 26.0–34.0)
MCHC: 32.5 g/dL (ref 32.0–36.0)
MCV: 78.6 fL — ABNORMAL LOW (ref 80.0–100.0)
Platelets: 181 K/uL (ref 150–440)
RBC: 3.98 MIL/uL (ref 3.80–5.20)
RDW: 17.2 % — ABNORMAL HIGH (ref 11.5–14.5)
WBC: 12.7 K/uL — ABNORMAL HIGH (ref 3.6–11.0)

## 2017-07-12 LAB — GLUCOSE, CAPILLARY
GLUCOSE-CAPILLARY: 122 mg/dL — AB (ref 65–99)
Glucose-Capillary: 190 mg/dL — ABNORMAL HIGH (ref 65–99)
Glucose-Capillary: 210 mg/dL — ABNORMAL HIGH (ref 65–99)
Glucose-Capillary: 215 mg/dL — ABNORMAL HIGH (ref 65–99)

## 2017-07-12 LAB — VITAMIN B12: VITAMIN B 12: 900 pg/mL (ref 180–914)

## 2017-07-12 LAB — FERRITIN: FERRITIN: 23 ng/mL (ref 11–307)

## 2017-07-12 LAB — FOLATE: FOLATE: 6.9 ng/mL (ref >5.9)

## 2017-07-12 MED ORDER — SODIUM CHLORIDE 0.9 % IV SOLN
510.0000 mg | Freq: Once | INTRAVENOUS | Status: AC
  Administered 2017-07-12: 510 mg via INTRAVENOUS
  Filled 2017-07-12: qty 17

## 2017-07-12 MED ORDER — HYDROMORPHONE HCL 1 MG/ML IJ SOLN
0.5000 mg | Freq: Once | INTRAMUSCULAR | Status: AC
  Administered 2017-07-12: 0.5 mg via INTRAVENOUS
  Filled 2017-07-12: qty 0.5

## 2017-07-12 NOTE — Progress Notes (Signed)
[employer] -  at [facility]   PATIENT NAME: Kaitlyn RanaMary Lewis    MR#:  [id number]  DATE OF BIRTH:  12/08/1940  SUBJECTIVE:   Pt. Here due to abdominal pain, diarrhea and CT showing some Colitis.  Pt. is still having some abdominal pain and diarrhea today.  Patient stool apparently was slightly blood streaked. Hg. Stable.   REVIEW OF SYSTEMS:    Review of Systems  Constitutional: Negative for chills and fever.  HENT: Negative for congestion and tinnitus.   Eyes: Negative for blurred vision and double vision.  Respiratory: Negative for cough, shortness of breath and wheezing.   Cardiovascular: Negative for chest pain, orthopnea and PND.  Gastrointestinal: Positive for abdominal pain, blood in stool and diarrhea. Negative for nausea and vomiting.  Genitourinary: Negative for dysuria and hematuria.  Neurological: Negative for dizziness, sensory change and focal weakness.  All other systems reviewed and are negative.   Nutrition: Clear Liquid Tolerating Diet: Yes Tolerating PT: Eval noted.    DRUG ALLERGIES:  No Known Allergies  VITALS:  Blood pressure (!) 144/72, pulse 89, temperature 98.2 F (36.8 C), resp. rate 17, height 5\' 5"  (1.651 m), weight 78.9 kg (174 lb), SpO2 97 %.  PHYSICAL EXAMINATION:   Physical Exam  GENERAL:  77 y.o.-year-old patient lying in bed in no acute distress.  EYES: Pupils equal, round, reactive to light and accommodation. No scleral icterus. Extraocular muscles intact.  HEENT: Head atraumatic, normocephalic. Oropharynx and nasopharynx clear.  NECK:  Supple, no jugular venous distention. No thyroid enlargement, no tenderness.  LUNGS: Normal breath sounds bilaterally, no wheezing, rales, rhonchi. No use of accessory muscles of respiration.  CARDIOVASCULAR: S1, S2 normal. No murmurs, rubs, or gallops.  ABDOMEN: Soft, Tender in LLQ no rebound, rigidity, nondistended. Bowel sounds present. No organomegaly or mass.  EXTREMITIES: No  cyanosis, clubbing or edema b/l.    NEUROLOGIC: Cranial nerves II through XII are intact. No focal Motor or sensory deficits b/l.   PSYCHIATRIC: The patient is alert and oriented x 3.  SKIN: No obvious rash, lesion, or ulcer.    LABORATORY PANEL:   CBC Recent Labs  Lab 07/12/17 0452 07/12/17 0719  WBC 12.7*  --   HGB 10.2* 10.2*  HCT 31.3*  --   PLT 181  --    ------------------------------------------------------------------------------------------------------------------  Chemistries  Recent Labs  Lab 07/11/17 0911 07/12/17 0452  NA 131* 131*  K 4.0 4.2  CL 97* 99*  CO2 24 25  GLUCOSE 240* 224*  BUN 29* 14  CREATININE 1.29* 1.22*  CALCIUM 8.7* 8.3*  AST 35  --   ALT 19  --   ALKPHOS 92  --   BILITOT 0.9  --    ------------------------------------------------------------------------------------------------------------------  Cardiac Enzymes Recent Labs  Lab 07/11/17 0911  TROPONINI <0.03   ------------------------------------------------------------------------------------------------------------------  RADIOLOGY:  Ct Abdomen Pelvis W Contrast  Result Date: 07/11/2017 CLINICAL DATA:  Abdominal pain with nausea, vomiting, and hematochezia. EXAM: CT ABDOMEN AND PELVIS WITH CONTRAST TECHNIQUE: Multidetector CT imaging of the abdomen and pelvis was performed using the standard protocol following bolus administration of intravenous contrast. CONTRAST:  75mL ISOVUE-300 IOPAMIDOL (ISOVUE-300) INJECTION 61% COMPARISON:  None. FINDINGS: Lower chest: No acute abnormality. Hepatobiliary: No focal liver abnormality is seen. Status post cholecystectomy. Mild common bile duct dilatation is likely secondary to post cholecystectomy state. Pancreas: Mild atrophy. No ductal dilatation or surrounding inflammatory changes. Spleen: Normal in size without focal abnormality. Adrenals/Urinary Tract: The adrenal glands are unremarkable. Mild bilateral renal  cortical atrophy. 1.2 cm  exophytic simple cyst arising from the midpole of the right kidney. No renal or ureteral calculi. No hydronephrosis. The bladder is unremarkable. Stomach/Bowel: There is moderate wall thickening of the descending and sigmoid colon with mild pericolonic inflammatory stranding. The remaining colon is unremarkable. Mild left-sided colonic diverticulosis. Normal appendix. The stomach and small bowel are within normal limits. No bowel obstruction. Vascular/Lymphatic: Minimal aortic atherosclerosis. No enlarged abdominal or pelvic lymph nodes. Reproductive: Status post hysterectomy. No adnexal masses. Other: Small amount of free fluid in the left paracolic gutter. No pneumoperitoneum. Musculoskeletal: No acute or significant osseous findings. Mild degenerative disc disease at L4-L5. IMPRESSION: 1. Moderate wall thickening of the descending and sigmoid colon with mild pericolonic inflammatory stranding, consistent with colitis, likely infectious or inflammatory in etiology. 2.  Aortic atherosclerosis (ICD10-I70.0). Electronically Signed   By: Obie Dredge M.D.   On: 07/11/2017 11:50     ASSESSMENT AND PLAN:   77 year old female with past medical history of essential hypertension, hyperlipidemia, GERD, diabetes, hypothyroidism who presented to the hospital due to abdominal pain, bloody stools and noted to have CT scan findings suggestive of colitis.  1. Colitis-unclear if this is infectious/inflammatory.  Patient presented with abdominal pain and bloody stool and CT scan findings suggestive of colitis.  Patient stool for C. difficile and gastrointestinal PCR are negative. -Continue supportive care with IV fluids, antiemetics, IV ciprofloxacin and Flagyl.  Patient still having abdominal pain and therefore diet has been changed to clear liquids for now.  Await gastroenterology input.  Patient may need a colonoscopy possibly.  2.  Hypothyroidism-continue Synthroid.  3. DM - cont. Lantus, SSI.  - follow  BS  4. DM Neuropathy -cont. Neurontin.   5. Hyperlipidemia - cont. Atorvastatin  6. GERD - cont. Protonix.   7. Depression - cont. Effexor  8. Essential HTN - cont. Propranolol.      All the records are reviewed and case discussed with Care Management/Social Worker. Management plans discussed with the patient, family and they are in agreement.  CODE STATUS: Full code  DVT Prophylaxis: Ted's & SCD's.   TOTAL TIME TAKING CARE OF THIS PATIENT: 30 minutes.   POSSIBLE D/C IN 1-2 DAYS, DEPENDING ON CLINICAL CONDITION.   Houston Siren M.D on 07/12/2017 at 1:52 PM  Between 7am to 6pm - Pager - [phone]  After 6pm go to [email or web address] - Social research officer, government  [employer] [facility]  Office  [phone]  CC: Primary care physician; Baltazar Najjar, MD

## 2017-07-12 NOTE — Consult Note (Addendum)
Kaitlyn Darby, MD [address]  [place]  [place], [place] [address]  Main: [phone]  Fax: [phone] Pager: [phone]   Consultation  Referring Provider:     No ref. provider found Primary Care Physician:  Burnett Harry, MD Primary Gastroenterologist: Dr. Sherri Sear         Reason for Consultation:     Colitis, bloody diarrhea  Date of Admission:  07/11/2017 Date of Consultation:  07/12/2017         HPI:   Kaitlyn Lewis is a 77 y.o. female with history of diabetes, hypertension, hyperlipidemia presents with 3 days history of and left lower quadrant abdominal pain and bloody diarrhea. She was tested negative for C. Difficile and other GI pathogens. She underwent CT which revealed thickening of the descending colon and left-sided diverticulosis. She is empirically started on Cipro and Flagyl. GI is consulted for further evaluation. She does have mild microcytic anemia. Hemoglobin 10.3 on admission. Baseline is around 11.8. She also has history of severe iron deficiency, last ferritin levels were 8.5 in 10/2016. She is on clear liquid diet and tolerating them well   NSAIDs: she takes BC powder daily for the last several years  Antiplts/Anticoagulants/Anti thrombotics: aspirin 81 mg daily  GI Procedures: none  Past Medical History:  Diagnosis Date  . Diabetes mellitus without complication ([place])   . GERD (gastroesophageal reflux disease)   . Hyperlipemia   . Hypertension   . Thyroid disease     Past Surgical History:  Procedure Laterality Date  . ABDOMINAL HYSTERECTOMY    . BRAIN SURGERY     benign tumor removal  . CHOLECYSTECTOMY    . THYROIDECTOMY      Prior to Admission medications   Medication Sig Start Date End Date Taking? Authorizing Provider  amitriptyline (ELAVIL) 25 MG tablet Take 50 mg by mouth at bedtime. 05/30/17  Yes [provider]  aspirin EC 81 MG tablet Take 81 mg by mouth daily.   Yes [provider]    atorvastatin (LIPITOR) 40 MG tablet Take 40 mg by mouth at bedtime. 04/30/17  Yes [provider]  Calcium Carbonate-Vitamin D 600-400 MG-UNIT tablet Take 1 tablet by mouth daily. 07/27/08  Yes [provider]  enalapril (VASOTEC) 5 MG tablet Take 5 mg by mouth at bedtime. 07/03/17  Yes [provider]  gabapentin (NEURONTIN) 600 MG tablet Take 600 mg by mouth 2 (two) times daily. 06/06/17  Yes [provider]  HUMULIN 70/30 KWIKPEN (70-30) 100 UNIT/ML PEN Inject 32-36 Units into the skin 2 (two) times daily. Inject 36 units in the morning and 32 units at night. 06/25/17  Yes [provider]  hydrOXYzine (ATARAX/VISTARIL) 25 MG tablet Take 25 mg by mouth 2 (two) times daily. 06/24/17  Yes [provider]  levothyroxine (SYNTHROID, LEVOTHROID) 88 MCG tablet Take 88 mcg by mouth daily. 05/20/17  Yes [provider]  LORazepam (ATIVAN) 0.5 MG tablet Take 0.5 mg by mouth daily as needed for anxiety or sleep. 04/25/17  Yes [provider]  magnesium oxide (MAG-OX) 400 MG tablet Take 400 mg by mouth 2 (two) times daily. 07/28/14  Yes [provider]  MELATONIN PO Take 1 tablet by mouth at bedtime.   Yes [provider]  metFORMIN (GLUCOPHAGE-XR) 500 MG 24 hr tablet Take 500 mg by mouth 2 (two) times daily. 05/10/17  Yes [provider]  omeprazole (PRILOSEC) 20 MG capsule Take 20 mg by mouth  daily. 04/24/17  Yes [provider]  PROAIR HFA 108 (90 Base) MCG/ACT inhaler Inhale 2 puffs into the lungs every 4 (four) hours as needed for shortness of breath. 04/24/17  Yes [provider]  propranolol (INDERAL) 20 MG tablet Take 20 mg by mouth 2 (two) times daily. 06/06/17  Yes [provider]  venlafaxine XR (EFFEXOR-XR) 75 MG 24 hr capsule Take 75 mg by mouth daily. 06/10/17  Yes [provider]  vitamin B-12 (CYANOCOBALAMIN) 1000 MCG tablet Take 1,000 mcg by mouth daily.   Yes [provider]    Family History  Problem Relation Age of Onset  . CAD Brother      Social History   Tobacco Use  . Smoking status: Never Smoker  . Smokeless tobacco: Never Used  Substance Use Topics  . Alcohol use: Never    Frequency: Never  . Drug use: Never    Allergies as of 07/11/2017  . (No Known Allergies)    Review of Systems:    All systems reviewed and negative except where noted in HPI.   Physical Exam:  Vital signs in last 24 hours: Temp:  [98.2 F (36.8 C)-98.8 F (37.1 C)] 98.6 F (37 C) (04/19 1939) Pulse Rate:  [85-105] 85 (04/19 1939) Resp:  [16-17] 16 (04/19 1939) BP: (105-150)/(46-83) 119/62 (04/19 1939) SpO2:  [95 %-98 %] 98 % (04/19 1939) Last BM Date: 07/12/17 General:   Pleasant, cooperative in NAD Head:  Normocephalic and atraumatic. Eyes:   No icterus.   Conjunctiva pink. PERRLA. Ears:  Normal auditory acuity. Neck:  Supple; no masses or thyroidomegaly Lungs: Respirations even and unlabored. Lungs clear to auscultation bilaterally.   No wheezes, crackles, or rhonchi.  Heart:  Regular rate and rhythm;  Without murmur, clicks, rubs or gallops Abdomen:  Soft, nondistended, mild diffuse tenderness. Normal bowel sounds. No appreciable masses or hepatomegaly.  No rebound or guarding.  Rectal:  Not performed. Msk:  Symmetrical without gross deformities.  Strength generalized weakness Extremities:  Without edema, cyanosis or clubbing. Neurologic:  Alert and oriented x3;  grossly normal neurologically. Skin:  Intact without significant lesions or rashes. Psych:  Alert and cooperative. Normal affect.  LAB RESULTS: CBC Latest Ref Rng & Units 07/12/2017 07/12/2017 07/12/2017  WBC 3.6 - 11.0 K/uL - - 12.7(H)  Hemoglobin 12.0 - 16.0 g/dL 9.5(L) 10.2(L) 10.2(L)  Hematocrit 35.0 - 47.0 % - - 31.3(L)  Platelets 150 - 440 K/uL - - 181    BMET BMP Latest Ref Rng & Units 07/12/2017 07/11/2017 07/03/2014  Glucose 65 - 99 mg/dL 224(H) 240(H) 234(H)  BUN 6 -  20 mg/dL 14 29(H) 19  Creatinine 0.44 - 1.00 mg/dL 1.22(H) 1.29(H) 1.14(H)  Sodium 135 - 145 mmol/L 131(L) 131(L) 133(L)  Potassium 3.5 - 5.1 mmol/L 4.2 4.0 3.7  Chloride 101 - 111 mmol/L 99(L) 97(L) 101  CO2 22 - 32 mmol/L 25 24 21(L)  Calcium 8.9 - 10.3 mg/dL 8.3(L) 8.7(L) 8.6(L)    LFT Hepatic Function Latest Ref Rng & Units 07/11/2017  Total Protein 6.5 - 8.1 g/dL 7.3  Albumin 3.5 - 5.0 g/dL 3.9  AST 15 - 41 U/L 35  ALT 14 - 54 U/L 19  Alk Phosphatase 38 - 126 U/L 92  Total Bilirubin 0.3 - 1.2 mg/dL 0.9     STUDIES: Ct Abdomen Pelvis W Contrast  Result Date: 07/11/2017 CLINICAL DATA:  Abdominal pain with nausea, vomiting, and hematochezia. EXAM: CT ABDOMEN AND PELVIS WITH CONTRAST TECHNIQUE: Multidetector  CT imaging of the abdomen and pelvis was performed using the standard protocol following bolus administration of intravenous contrast. CONTRAST:  63m ISOVUE-300 IOPAMIDOL (ISOVUE-300) INJECTION 61% COMPARISON:  None. FINDINGS: Lower chest: No acute abnormality. Hepatobiliary: No focal liver abnormality is seen. Status post cholecystectomy. Mild common bile duct dilatation is likely secondary to post cholecystectomy state. Pancreas: Mild atrophy. No ductal dilatation or surrounding inflammatory changes. Spleen: Normal in size without focal abnormality. Adrenals/Urinary Tract: The adrenal glands are unremarkable. Mild bilateral renal cortical atrophy. 1.2 cm exophytic simple cyst arising from the midpole of the right kidney. No renal or ureteral calculi. No hydronephrosis. The bladder is unremarkable. Stomach/Bowel: There is moderate wall thickening of the descending and sigmoid colon with mild pericolonic inflammatory stranding. The remaining colon is unremarkable. Mild left-sided colonic diverticulosis. Normal appendix. The stomach and small bowel are within normal limits. No bowel obstruction. Vascular/Lymphatic: Minimal aortic atherosclerosis. No enlarged abdominal or pelvic lymph  nodes. Reproductive: Status post hysterectomy. No adnexal masses. Other: Small amount of free fluid in the left paracolic gutter. No pneumoperitoneum. Musculoskeletal: No acute or significant osseous findings. Mild degenerative disc disease at L4-L5. IMPRESSION: 1. Moderate wall thickening of the descending and sigmoid colon with mild pericolonic inflammatory stranding, consistent with colitis, likely infectious or inflammatory in etiology. 2.  Aortic atherosclerosis (ICD10-I70.0). Electronically Signed   By: WTitus DubinM.D.   On: 07/11/2017 11:50      Impression / Plan:   MKilee Heddingis a 77y.o. Caucasian female with metabolic syndrome, presents with 3 days' history of bloody diarrhea and abdominal pain, CT revealed left colon thickening. She has history of heavy NSAID use, takes Excedrin chronically for headaches. Differentials include ischemic colitis or inflammatory bowel disease or NSAID induced colitis or acute diverticulitis   - Continue clear liquid diet - Monitor CBC closely - Okay to continue Cipro and Flagyl - Recommend EGD and colonoscopy for further evaluation. However, patient is adamant and does not want to undergo these procedures. Her daughter is bedside who is trying to convince her.  - I told her that if she is not improving within next 24 hours, we should proceed with endoscopy evaluation - avoid NSAIDs  Thank you for involving me in the care of this patient. Will follow along with you    LOS: 1 day   RSherri Sear MD  07/12/2017, 8:58 PM   Note: This dictation was prepared with Dragon dictation along with smaller phrase technology. Any transcriptional errors that result from this process are unintentional.

## 2017-07-12 NOTE — Progress Notes (Signed)
NT found pt sitting in the floor after using the bathroom. Pt stated she did not fall, she just sit in the floor because started to feel light headed. RN check pt VS and they are normal. RN also check pt skin and did not found anything indication pt hit herself. Pt is alert and oriented X4. MD also notified.no new orders.  Will continue to assess and monitor pt.

## 2017-07-12 NOTE — Consult Note (Signed)
Pharmacy Antibiotic Note  Kaitlyn Lewis is a 77 y.o. female admitted on 07/11/2017 with colitis.  Pharmacy has been consulted for ciprofloxacin dosing.  Plan: Cipro 400mg  IV q 12hr  Height: 5\' 5"  (165.1 cm) Weight: 174 lb (78.9 kg) IBW/kg (Calculated) : 57  Temp (24hrs), Avg:99 F (37.2 C), Min:98.2 F (36.8 C), Max:99.8 F (37.7 C)  Recent Labs  Lab 07/11/17 0911 07/11/17 1142 07/12/17 0452  WBC 12.5*  --  12.7*  CREATININE 1.29*  --  1.22*  LATICACIDVEN  --  2.1*  --     Estimated Creatinine Clearance: 41.4 mL/min (A) (by C-G formula based on SCr of 1.22 mg/dL (H)).    No Known Allergies  Antimicrobials this admission: cipro 4/18 >>  metronidazole 4/18 >>   Dose adjustments this admission:   Microbiology results: 4/18 GI panel: neg 4/18 Cdiff :neg  Thank you for allowing pharmacy to be a part of this patient's care.  Kaitlyn Lewis PharmD Clinical Pharmacist 07/12/2017

## 2017-07-13 ENCOUNTER — Encounter: Payer: Self-pay | Admitting: Anesthesiology

## 2017-07-13 DIAGNOSIS — K55039 Acute (reversible) ischemia of large intestine, extent unspecified: Secondary | ICD-10-CM | POA: Diagnosis not present

## 2017-07-13 DIAGNOSIS — K529 Noninfective gastroenteritis and colitis, unspecified: Secondary | ICD-10-CM | POA: Diagnosis not present

## 2017-07-13 LAB — CBC
HCT: 29.5 % — ABNORMAL LOW (ref 35.0–47.0)
HEMOGLOBIN: 9.9 g/dL — AB (ref 12.0–16.0)
MCH: 26.6 pg (ref 26.0–34.0)
MCHC: 33.7 g/dL (ref 32.0–36.0)
MCV: 78.8 fL — ABNORMAL LOW (ref 80.0–100.0)
Platelets: 150 10*3/uL (ref 150–440)
RBC: 3.74 MIL/uL — AB (ref 3.80–5.20)
RDW: 17.5 % — ABNORMAL HIGH (ref 11.5–14.5)
WBC: 9.4 10*3/uL (ref 3.6–11.0)

## 2017-07-13 LAB — BASIC METABOLIC PANEL
Anion gap: 5 (ref 5–15)
BUN: 15 mg/dL (ref 6–20)
CHLORIDE: 104 mmol/L (ref 101–111)
CO2: 26 mmol/L (ref 22–32)
Calcium: 8.3 mg/dL — ABNORMAL LOW (ref 8.9–10.3)
Creatinine, Ser: 1.22 mg/dL — ABNORMAL HIGH (ref 0.44–1.00)
GFR calc Af Amer: 49 mL/min — ABNORMAL LOW (ref >60)
GFR calc non Af Amer: 42 mL/min — ABNORMAL LOW (ref >60)
GLUCOSE: 178 mg/dL — AB (ref 65–99)
POTASSIUM: 3.9 mmol/L (ref 3.5–5.1)
Sodium: 135 mmol/L (ref 135–145)

## 2017-07-13 LAB — RETICULOCYTES
RBC.: 3.78 MIL/uL — AB (ref 3.80–5.20)
RETIC CT PCT: 1.6 % (ref 0.4–3.1)
Retic Count, Absolute: 60.5 10*3/uL (ref 19.0–183.0)

## 2017-07-13 LAB — GLUCOSE, CAPILLARY
GLUCOSE-CAPILLARY: 144 mg/dL — AB (ref 65–99)
GLUCOSE-CAPILLARY: 125 mg/dL — AB (ref 65–99)
Glucose-Capillary: 96 mg/dL (ref 65–99)
Glucose-Capillary: 140 mg/dL — ABNORMAL HIGH (ref 65–99)

## 2017-07-13 MED ORDER — PEG 3350-KCL-NA BICARB-NACL 420 G PO SOLR
4000.0000 mL | Freq: Once | ORAL | Status: AC
Start: 2017-07-13 — End: 2017-07-13
  Administered 2017-07-13: 4000 mL via ORAL
  Filled 2017-07-13 (×2): qty 4000

## 2017-07-13 NOTE — Progress Notes (Signed)
[facility] - [place] [facility]   PATIENT NAME: Kaitlyn Lewis    MR#:  [id number]  DATE OF BIRTH:  08/09/1940  SUBJECTIVE:   Patient's diarrhea has improved, still having some abdominal pain.  Seen by gastroenterology and they wanted to do a colonoscopy with patient refused.  Tolerating clear liquid diet well.  REVIEW OF SYSTEMS:    Review of Systems  Constitutional: Negative for chills and fever.  HENT: Negative for congestion and tinnitus.   Eyes: Negative for blurred vision and double vision.  Respiratory: Negative for cough, shortness of breath and wheezing.   Cardiovascular: Negative for chest pain, orthopnea and PND.  Gastrointestinal: Positive for abdominal pain and diarrhea. Negative for blood in stool, nausea and vomiting.  Genitourinary: Negative for dysuria and hematuria.  Neurological: Negative for dizziness, sensory change and focal weakness.  All other systems reviewed and are negative.   Nutrition: Clear Liquid Tolerating Diet: Yes Tolerating PT: Eval noted.    DRUG ALLERGIES:  No Known Allergies  VITALS:  Blood pressure (!) 146/81, pulse 80, temperature 97.8 F (36.6 C), temperature source Oral, resp. rate 16, height 5\' 5"  (1.651 m), weight 78.9 kg (174 lb), SpO2 95 %.  PHYSICAL EXAMINATION:   Physical Exam  GENERAL:  77 y.o.-year-old patient lying in bed in no acute distress.  EYES: Pupils equal, round, reactive to light and accommodation. No scleral icterus. Extraocular muscles intact.  HEENT: Head atraumatic, normocephalic. Oropharynx and nasopharynx clear.  NECK:  Supple, no jugular venous distention. No thyroid enlargement, no tenderness.  LUNGS: Normal breath sounds bilaterally, no wheezing, rales, rhonchi. No use of accessory muscles of respiration.  CARDIOVASCULAR: S1, S2 normal. No murmurs, rubs, or gallops.  ABDOMEN: Soft, Tender in LLQ no rebound, rigidity, nondistended. Bowel sounds present. No organomegaly or mass.   EXTREMITIES: No cyanosis, clubbing or edema b/l.    NEUROLOGIC: Cranial nerves II through XII are intact. No focal Motor or sensory deficits b/l.   PSYCHIATRIC: The patient is alert and oriented x 3.  SKIN: No obvious rash, lesion, or ulcer.    LABORATORY PANEL:   CBC Recent Labs  Lab 07/13/17 0555  WBC 9.4  HGB 9.9*  HCT 29.5*  PLT 150   ------------------------------------------------------------------------------------------------------------------  Chemistries  Recent Labs  Lab 07/11/17 0911  07/13/17 0555  NA 131*   < > 135  K 4.0   < > 3.9  CL 97*   < > 104  CO2 24   < > 26  GLUCOSE 240*   < > 178*  BUN 29*   < > 15  CREATININE 1.29*   < > 1.22*  CALCIUM 8.7*   < > 8.3*  AST 35  --   --   ALT 19  --   --   ALKPHOS 92  --   --   BILITOT 0.9  --   --    < > = values in this interval not displayed.   ------------------------------------------------------------------------------------------------------------------  Cardiac Enzymes Recent Labs  Lab 07/11/17 0911  TROPONINI <0.03   ------------------------------------------------------------------------------------------------------------------  RADIOLOGY:  No results found.   ASSESSMENT AND PLAN:   77 year old female with past medical history of essential hypertension, hyperlipidemia, GERD, diabetes, hypothyroidism who presented to the hospital due to abdominal pain, bloody stools and noted to have CT scan findings suggestive of colitis.  1. Colitis-unclear if this is infectious/inflammatory.  Patient presented with abdominal pain and bloody stool and CT scan findings suggestive of colitis.  Patient stool for  C. difficile and gastrointestinal PCR are negative. -Continue supportive care with IV fluids, antiemetics, IV ciprofloxacin and Flagyl.   -Seen by gastroenterology and they want to do a colonoscopy but patient refused.  Discussed with patient's daughter today and she talked to her mother and they are  in agreement with colonoscopy now.  Spoke to gastroenterology and for colonoscopy tomorrow now.  Continue supportive care as mentioned above.  2.  Hypothyroidism-continue Synthroid.  3. DM - cont. Lantus, SSI.  - follow BS  4. DM Neuropathy -cont. Neurontin.   5. Hyperlipidemia - cont. Atorvastatin  6. GERD - cont. Protonix.   7. Depression - cont. Effexor  8. Essential HTN - cont. Propranolol.   Pending disposition post colonoscopy.   All the records are reviewed and case discussed with Care Management/Social Worker. Management plans discussed with the patient, family and they are in agreement.  CODE STATUS: Full code  DVT Prophylaxis: Ted's & SCD's.   TOTAL TIME TAKING CARE OF THIS PATIENT: 30 minutes.   POSSIBLE D/C IN 1-2 DAYS, DEPENDING ON CLINICAL CONDITION.   Houston Siren M.D on 07/13/2017 at 1:15 PM  Between 7am to 6pm - Pager - [phone]  After 6pm go to [email or web address] - Social research officer, government  [facility] [place] [facility]  Office  [phone]  CC: Primary care physician; Baltazar Najjar, MD

## 2017-07-13 NOTE — Progress Notes (Signed)
Kaitlyn Repressohini R Hodge Stachnik, MD [address]  [address]  [place], [place] [address]  Main: [phone]  Fax: [phone] Pager: [phone]   Subjective: Patient notices less blood in her bowel movement. Continues to have lower abdominal pain, her family's bedside. She is agreeable to undergo EGD and colonoscopy tomorrow   Objective: Vital signs in last 24 hours: Vitals:   07/12/17 1939 07/13/17 0446 07/13/17 0908 07/13/17 1428  BP: 119/62 119/64 (!) 146/81 139/74  Pulse: 85 79 80 81  Resp: 16 16  17   Temp: 98.6 F (37 C) 97.8 F (36.6 C)  98.1 F (36.7 C)  TempSrc: Oral Oral  Oral  SpO2: 98% 95%  96%  Weight:      Height:       Weight change:   Intake/Output Summary (Last 24 hours) at 07/13/2017 1809 Last data filed at 07/13/2017 1653 Gross per 24 hour  Intake 2694 ml  Output 1200 ml  Net 1494 ml     Exam: Heart:: Regular rate and rhythm or S1S2 present Lungs: clear to auscultation Abdomen: soft, nontender, normal bowel sounds   Lab Results: CBC Latest Ref Rng & Units 07/13/2017 07/12/2017 07/12/2017  WBC 3.6 - 11.0 K/uL 9.4 - -  Hemoglobin 12.0 - 16.0 g/dL 6.3(O9.9(L) 7.5(I9.5(L) 10.2(L)  Hematocrit 35.0 - 47.0 % 29.5(L) - -  Platelets 150 - 440 K/uL 150 - -   CMP Latest Ref Rng & Units 07/13/2017 07/12/2017 07/11/2017  Glucose 65 - 99 mg/dL 433(I178(H) 951(O224(H) 841(Y240(H)  BUN 6 - 20 mg/dL 15 14 60(Y29(H)  Creatinine 0.44 - 1.00 mg/dL 3.01(S1.22(H) 0.10(X1.22(H) 3.23(F1.29(H)  Sodium 135 - 145 mmol/L 135 131(L) 131(L)  Potassium 3.5 - 5.1 mmol/L 3.9 4.2 4.0  Chloride 101 - 111 mmol/L 104 99(L) 97(L)  CO2 22 - 32 mmol/L 26 25 24   Calcium 8.9 - 10.3 mg/dL 8.3(L) 8.3(L) 8.7(L)  Total Protein 6.5 - 8.1 g/dL - - 7.3  Total Bilirubin 0.3 - 1.2 mg/dL - - 0.9  Alkaline Phos 38 - 126 U/L - - 92  AST 15 - 41 U/L - - 35  ALT 14 - 54 U/L - - 19   Micro Results: Recent Results (from the past 240 hour(s))  C difficile quick scan w PCR reflex     Status: None   Collection Time: 07/11/17  8:51 AM  Result  Value Ref Range Status   C Diff antigen NEGATIVE NEGATIVE Final   C Diff toxin NEGATIVE NEGATIVE Final   C Diff interpretation No C. difficile detected.  Final    Comment: VALID Performed at [facility], [address] [place], [place] [address]   Gastrointestinal Panel by PCR , Stool     Status: None   Collection Time: 07/11/17  8:51 AM  Result Value Ref Range Status   Campylobacter species NOT DETECTED NOT DETECTED Final   Plesimonas shigelloides NOT DETECTED NOT DETECTED Final   Salmonella species NOT DETECTED NOT DETECTED Final   Yersinia enterocolitica NOT DETECTED NOT DETECTED Final   Vibrio species NOT DETECTED NOT DETECTED Final   Vibrio cholerae NOT DETECTED NOT DETECTED Final   Enteroaggregative E coli (EAEC) NOT DETECTED NOT DETECTED Final   Enteropathogenic E coli (EPEC) NOT DETECTED NOT DETECTED Final   Enterotoxigenic E coli (ETEC) NOT DETECTED NOT DETECTED Final   Shiga like toxin producing E coli (STEC) NOT DETECTED NOT DETECTED Final   Shigella/Enteroinvasive E coli (EIEC) NOT DETECTED NOT DETECTED Final   Cryptosporidium NOT DETECTED  NOT DETECTED Final   Cyclospora cayetanensis NOT DETECTED NOT DETECTED Final   Entamoeba histolytica NOT DETECTED NOT DETECTED Final   Giardia lamblia NOT DETECTED NOT DETECTED Final   Adenovirus F40/41 NOT DETECTED NOT DETECTED Final   Astrovirus NOT DETECTED NOT DETECTED Final   Norovirus GI/GII NOT DETECTED NOT DETECTED Final   Rotavirus A NOT DETECTED NOT DETECTED Final   Sapovirus (I, II, IV, and V) NOT DETECTED NOT DETECTED Final    Comment: Performed at [facility], [address] [place], [place] [address]   Studies/Results: No results found. Medications: I have reviewed the patient's current medications. Scheduled Meds: . amitriptyline  50 mg Oral QHS  . atorvastatin  40 mg Oral q1800  . gabapentin  300 mg Oral TID  . insulin aspart  0-5 Units Subcutaneous QHS  . insulin aspart  0-9 Units  Subcutaneous TID WC  . insulin glargine  20 Units Subcutaneous BID  . levothyroxine  88 mcg Oral QAC breakfast  . pantoprazole  40 mg Oral Daily  . polyethylene glycol-electrolytes  4,000 mL Oral Once  . propranolol  20 mg Oral BID  . venlafaxine XR  75 mg Oral Daily   Continuous Infusions: . sodium chloride    . ciprofloxacin Stopped (07/13/17 1002)  . metronidazole Stopped (07/13/17 1355)   PRN Meds:.acetaminophen **OR** acetaminophen, morphine injection, ondansetron **OR** ondansetron (ZOFRAN) IV, traMADol   Assessment: Active Problems:   Acute colitis  Kaitlyn Lewis is a 77 y.o. Caucasian female with metabolic syndrome, presents with 3 days' history of bloody diarrhea and abdominal pain, CT revealed left colon thickening. She has history of heavy NSAID use, takes Excedrin chronically for headaches. Differentials include ischemic colitis or inflammatory bowel disease or NSAID induced colitis or acute diverticulitis   Plan: - Continue clear liquid diet - Monitor CBC closely - Okay to continue Cipro and Flagyl - Recommend EGD and colonoscopy for further evaluation. Patient is now agreeable - avoid NSAIDs  Thank you for involving me in the care of this patient. Will follow along with you     LOS: 2 days   Irl Bodie 07/13/2017, 6:09 PM

## 2017-07-14 ENCOUNTER — Encounter
Admission: EM | Disposition: A | Discharge status: Home / Self Care | Payer: Self-pay | Source: Home / Self Care | Attending: Specialist

## 2017-07-14 ENCOUNTER — Inpatient Hospital Stay: Payer: Medicare Other | Admitting: Anesthesiology

## 2017-07-14 DIAGNOSIS — R1030 Lower abdominal pain, unspecified: Secondary | ICD-10-CM

## 2017-07-14 DIAGNOSIS — K449 Diaphragmatic hernia without obstruction or gangrene: Secondary | ICD-10-CM

## 2017-07-14 DIAGNOSIS — D500 Iron deficiency anemia secondary to blood loss (chronic): Secondary | ICD-10-CM | POA: Diagnosis not present

## 2017-07-14 DIAGNOSIS — K3189 Other diseases of stomach and duodenum: Secondary | ICD-10-CM

## 2017-07-14 DIAGNOSIS — K529 Noninfective gastroenteritis and colitis, unspecified: Secondary | ICD-10-CM | POA: Diagnosis not present

## 2017-07-14 DIAGNOSIS — K55039 Acute (reversible) ischemia of large intestine, extent unspecified: Secondary | ICD-10-CM | POA: Diagnosis not present

## 2017-07-14 DIAGNOSIS — K633 Ulcer of intestine: Secondary | ICD-10-CM

## 2017-07-14 DIAGNOSIS — Z791 Long term (current) use of non-steroidal anti-inflammatories (NSAID): Secondary | ICD-10-CM

## 2017-07-14 DIAGNOSIS — R933 Abnormal findings on diagnostic imaging of other parts of digestive tract: Secondary | ICD-10-CM

## 2017-07-14 HISTORY — PX: ESOPHAGOGASTRODUODENOSCOPY: SHX5428

## 2017-07-14 HISTORY — PX: COLONOSCOPY: SHX5424

## 2017-07-14 LAB — GLUCOSE, CAPILLARY
GLUCOSE-CAPILLARY: 76 mg/dL (ref 65–99)
GLUCOSE-CAPILLARY: 101 mg/dL — AB (ref 65–99)
Glucose-Capillary: 108 mg/dL — ABNORMAL HIGH (ref 65–99)

## 2017-07-14 SURGERY — EGD (ESOPHAGOGASTRODUODENOSCOPY)
Anesthesia: General

## 2017-07-14 MED ORDER — FERROUS SULFATE 325 (65 FE) MG PO TABS
325.0000 mg | ORAL_TABLET | Freq: Every day | ORAL | Status: DC
  Filled 2017-07-14 (×2): qty 1

## 2017-07-14 MED ORDER — SODIUM CHLORIDE 0.9 % IV SOLN
510.0000 mg | Freq: Once | INTRAVENOUS | Status: AC
  Administered 2017-07-14: 510 mg via INTRAVENOUS
  Filled 2017-07-14: qty 17

## 2017-07-14 MED ORDER — FERROUS SULFATE 325 (65 FE) MG PO TABS: 325.0000 mg | ORAL_TABLET | Freq: Every day | ORAL | 1 refills | Status: AC

## 2017-07-14 MED ORDER — PROPOFOL 10 MG/ML IV BOLUS
INTRAVENOUS | Status: DC | PRN
  Administered 2017-07-14 (×2): 20 mg via INTRAVENOUS
  Administered 2017-07-14 (×2): 50 mg via INTRAVENOUS

## 2017-07-14 MED ORDER — PROPOFOL 500 MG/50ML IV EMUL
INTRAVENOUS | Status: AC
  Filled 2017-07-14: qty 50

## 2017-07-14 MED ORDER — PROPOFOL 500 MG/50ML IV EMUL
INTRAVENOUS | Status: DC | PRN
  Administered 2017-07-14: 35 ug/kg/min via INTRAVENOUS

## 2017-07-14 MED ORDER — CIPROFLOXACIN HCL 250 MG PO TABS: 250.0000 mg | ORAL_TABLET | Freq: Two times a day (BID) | ORAL | 0 refills | Status: AC

## 2017-07-14 MED ORDER — METRONIDAZOLE 500 MG PO TABS: 500.0000 mg | ORAL_TABLET | Freq: Three times a day (TID) | ORAL | 0 refills | Status: AC

## 2017-07-14 NOTE — Progress Notes (Signed)
[facility] - [place] at [facility]   PATIENT NAME: Kaitlyn Lewis    MR#:  [id number]  DATE OF BIRTH:  05/15/1940  SUBJECTIVE:   His diarrhea has improved.  She still has some vague abdominal pain.  Plan for upper GI endoscopy and colonoscopy today.  REVIEW OF SYSTEMS:    Review of Systems  Constitutional: Negative for chills and fever.  HENT: Negative for congestion and tinnitus.   Eyes: Negative for blurred vision and double vision.  Respiratory: Negative for cough, shortness of breath and wheezing.   Cardiovascular: Negative for chest pain, orthopnea and PND.  Gastrointestinal: Positive for abdominal pain. Negative for blood in stool, diarrhea, nausea and vomiting.  Genitourinary: Negative for dysuria and hematuria.  Neurological: Negative for dizziness, sensory change and focal weakness.  All other systems reviewed and are negative.   Nutrition: NPO for colonoscopy/Endoscopy Tolerating Diet: No Tolerating PT: Eval noted.    DRUG ALLERGIES:  No Known Allergies  VITALS:  Blood pressure (!) 152/75, pulse 75, temperature 98.8 F (37.1 C), temperature source Oral, resp. rate 20, height 5\' 5"  (1.651 m), weight 78.9 kg (174 lb), SpO2 97 %.  PHYSICAL EXAMINATION:   Physical Exam  GENERAL:  77 y.o.-year-old patient lying in bed in no acute distress.  EYES: Pupils equal, round, reactive to light and accommodation. No scleral icterus. Extraocular muscles intact.  HEENT: Head atraumatic, normocephalic. Oropharynx and nasopharynx clear.  NECK:  Supple, no jugular venous distention. No thyroid enlargement, no tenderness.  LUNGS: Normal breath sounds bilaterally, no wheezing, rales, rhonchi. No use of accessory muscles of respiration.  CARDIOVASCULAR: S1, S2 normal. No murmurs, rubs, or gallops.  ABDOMEN: Soft, NT, nondistended. Bowel sounds present. No organomegaly or mass.  EXTREMITIES: No cyanosis, clubbing or edema b/l.    NEUROLOGIC: Cranial nerves II  through XII are intact. No focal Motor or sensory deficits b/l.   PSYCHIATRIC: The patient is alert and oriented x 3.  SKIN: No obvious rash, lesion, or ulcer.    LABORATORY PANEL:   CBC Recent Labs  Lab 07/13/17 0555  WBC 9.4  HGB 9.9*  HCT 29.5*  PLT 150   ------------------------------------------------------------------------------------------------------------------  Chemistries  Recent Labs  Lab 07/11/17 0911  07/13/17 0555  NA 131*   < > 135  K 4.0   < > 3.9  CL 97*   < > 104  CO2 24   < > 26  GLUCOSE 240*   < > 178*  BUN 29*   < > 15  CREATININE 1.29*   < > 1.22*  CALCIUM 8.7*   < > 8.3*  AST 35  --   --   ALT 19  --   --   ALKPHOS 92  --   --   BILITOT 0.9  --   --    < > = values in this interval not displayed.   ------------------------------------------------------------------------------------------------------------------  Cardiac Enzymes Recent Labs  Lab 07/11/17 0911  TROPONINI <0.03   ------------------------------------------------------------------------------------------------------------------  RADIOLOGY:  No results found.   ASSESSMENT AND PLAN:   77 year old female with past medical history of essential hypertension, hyperlipidemia, GERD, diabetes, hypothyroidism who presented to the hospital due to abdominal pain, bloody stools and noted to have CT scan findings suggestive of colitis.  1. Colitis-unclear if this is infectious/inflammatory.  Patient presented with abdominal pain and bloody stool and CT scan findings suggestive of colitis.  Patient stool for C. difficile and gastrointestinal PCR are negative. -Continue supportive care with IV fluids,  antiemetics, IV ciprofloxacin and Flagyl.   Seen by gastroenterology and patient is in agreement with colonoscopy and plan for that today.  Patient also to get endoscopy today.  Clinically the patient is already feeling better with IV antibiotics and supportive care.  2.   Hypothyroidism-continue Synthroid.  3. DM - cont. Lantus, SSI.  - follow BS  4. DM Neuropathy -cont. Neurontin.   5. Hyperlipidemia - cont. Atorvastatin  6. GERD - cont. Protonix.   7. Depression - cont. Effexor  8. Essential HTN - cont. Propranolol.    All the records are reviewed and case discussed with Care Management/Social Worker. Management plans discussed with the patient, family and they are in agreement.  CODE STATUS: Full code  DVT Prophylaxis: Ted's & SCD's.   TOTAL TIME TAKING CARE OF THIS PATIENT: 25 minutes.   POSSIBLE D/C IN 1-2 DAYS, DEPENDING ON CLINICAL CONDITION.   Houston Siren M.D on 07/14/2017 at 11:49 AM  Between 7am to 6pm - Pager - [phone]  After 6pm go to [email or web address] - Social research officer, government  [facility] [place] [facility]  Office  [phone]  CC: Primary care physician; Baltazar Najjar, MD

## 2017-07-14 NOTE — Op Note (Signed)
[facility] Gastroenterology Patient Name: Kaitlyn Lewis Procedure Date: 07/14/2017 2:34 PM MRN: [id number] Account #: [id number] Date of Birth: June 09, 1940 Admit Type: Outpatient Age: 77 Room: [facility] ENDO ROOM 4 Gender: Female Note Status: Finalized Procedure:            Upper GI endoscopy Indications:          Iron deficiency anemia secondary to chronic blood loss,                        h/o chronic NSAID use Providers:            Lin Landsman MD, MD Medicines:            Monitored Anesthesia Care Complications:        No immediate complications. Estimated blood loss:                        Minimal. Procedure:            Pre-Anesthesia Assessment:                       - Prior to the procedure, a History and Physical was                        performed, and patient medications and allergies were                        reviewed. The patient is competent. The risks and                        benefits of the procedure and the sedation options and                        risks were discussed with the patient. All questions                        were answered and informed consent was obtained.                        Patient identification and proposed procedure were                        verified by the physician, the nurse, the                        anesthesiologist, the anesthetist and the technician in                        the pre-procedure area in the procedure room in the                        endoscopy suite. Mental Status Examination: alert and                        oriented. Airway Examination: normal oropharyngeal                        airway and neck mobility. Respiratory Examination:                        clear  to auscultation. CV Examination: normal.                        Prophylactic Antibiotics: The patient does not require                        prophylactic antibiotics. Prior Anticoagulants: The                        patient has taken  previous NSAID medication. ASA Grade                        Assessment: III - A patient with severe systemic                        disease. After reviewing the risks and benefits, the                        patient was deemed in satisfactory condition to undergo                        the procedure. The anesthesia plan was to use monitored                        anesthesia care (MAC). Immediately prior to                        administration of medications, the patient was                        re-assessed for adequacy to receive sedatives. The                        heart rate, respiratory rate, oxygen saturations, blood                        pressure, adequacy of pulmonary ventilation, and                        response to care were monitored throughout the                        procedure. The physical status of the patient was                        re-assessed after the procedure.                       After obtaining informed consent, the endoscope was                        passed under direct vision. Throughout the procedure,                        the patient's blood pressure, pulse, and oxygen                        saturations were monitored continuously. The Endoscope                        was introduced through the  mouth, and advanced to the                        second part of duodenum. The upper GI endoscopy was                        accomplished without difficulty. The patient tolerated                        the procedure well. Findings:      The duodenal bulb and second portion of the duodenum were normal.      A small healed ulcer was found on the greater curvature of the gastric       antrum.      Diffuse mildly erythematous mucosa without bleeding was found in the       gastric body. Biopsies were taken with a cold forceps for Helicobacter       pylori testing.      The cardia and gastric fundus were normal on retroflexion.      A small hiatal hernia was  present.      The gastroesophageal junction and examined esophagus were normal. Impression:           - Normal duodenal bulb and second portion of the                        duodenum.                       - Scar in the gastric antrum (greater curvature).                       - Erythematous mucosa in the gastric body. Biopsied.                       - Small hiatal hernia.                       - Normal gastroesophageal junction and esophagus. Recommendation:       - Await pathology results.                       - No ibuprofen, naproxen, or other non-steroidal                        anti-inflammatory drugs. Procedure Code(s):    --- Professional ---                       [phone], Esophagogastroduodenoscopy, flexible, transoral;                        with biopsy, single or multiple Diagnosis Code(s):    --- Professional ---                       K31.89, Other diseases of stomach and duodenum                       K44.9, Diaphragmatic hernia without obstruction or                        gangrene  D50.0, Iron deficiency anemia secondary to blood loss                        (chronic) CPT copyright 2017 American Medical Association. All rights reserved. The codes documented in this report are preliminary and upon coder review may  be revised to meet current compliance requirements. Dr. Ulyess Mort Lin Landsman MD, MD 07/14/2017 3:04:41 PM This report has been signed electronically. Number of Addenda: 0 Note Initiated On: 07/14/2017 2:34 PM      [facility]

## 2017-07-14 NOTE — Anesthesia Preprocedure Evaluation (Signed)
Anesthesia Evaluation  Patient identified by MRN, date of birth, ID band Patient awake    Reviewed: Allergy & Precautions, NPO status , Patient's Chart, lab work & pertinent test results, reviewed documented beta blocker date and time   Airway Mallampati: III  TM Distance: >3 FB     Dental  (+) Chipped, Upper Dentures, Lower Dentures   Pulmonary           Cardiovascular hypertension, Pt. on medications and Pt. on home beta blockers      Neuro/Psych    GI/Hepatic GERD  ,  Endo/Other  diabetes, Type 2  Renal/GU      Musculoskeletal   Abdominal   Peds  Hematology   Anesthesia Other Findings Hb 9.9. 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  Reproductive/Obstetrics                             Anesthesia Physical Anesthesia Plan  ASA: III  Anesthesia Plan: General   Post-op Pain Management:    Induction: Intravenous  PONV Risk Score and Plan:   Airway Management Planned:   Additional Equipment:   Intra-op Plan:   Post-operative Plan:   Informed Consent: I have reviewed the patients History and Physical, chart, labs and discussed the procedure including the risks, benefits and alternatives for the proposed anesthesia with the patient or authorized representative who has indicated his/her understanding and acceptance.     Plan Discussed with: CRNA  Anesthesia Plan Comments:         Anesthesia Quick Evaluation

## 2017-07-14 NOTE — Progress Notes (Signed)
EGD and colonoscopy post procedure note:  Healed ulcer in gastric antrum and gastric erythema Ulcerated inflamed mucosa in sigmoid colon with severe diverticulosis c/w ischemic colitis or SCAD Normal TI and rest of the colon  Recs: Advance diet as tolerated Cipro+flagyl for 7days  Bentyl for abdominal pain F/u path Avoid NSAIDs Oral iron as outpt F/u in GI clinic in 1-2 weeks after discharge  Arlyss Repressohini R Tamaya Pun, MD [address]  [address]  [place], [place] [address]  Main: [phone]  Fax: [phone] Pager: [phone]

## 2017-07-14 NOTE — Progress Notes (Signed)
Discharge teaching given to patient, patient verbalized understanding and had no questions. Patient IV removed. Patient will be transported home by family. All patient belongings gathered prior to leaving.  

## 2017-07-14 NOTE — Transfer of Care (Signed)
Immediate Anesthesia Transfer of Care Note  Patient: Kaitlyn Lewis  Procedure(s) Performed: ESOPHAGOGASTRODUODENOSCOPY (EGD) (N/A ) COLONOSCOPY (N/A )  Patient Location: PACU  Anesthesia Type:General  Level of Consciousness: awake, alert , oriented and patient cooperative  Airway & Oxygen Therapy: Patient Spontanous Breathing  Post-op Assessment: Report given to RN and Post -op Vital signs reviewed and stable  Post vital signs: Reviewed and stable  Last Vitals:  Vitals Value Taken Time  BP 114/89 07/14/2017  3:42 PM  Temp 36.9 C 07/14/2017  3:41 PM  Pulse 70 07/14/2017  3:43 PM  Resp 16 07/14/2017  3:43 PM  SpO2 96 % 07/14/2017  3:43 PM  Vitals shown include unvalidated device data.  Last Pain:  Vitals:   07/14/17 1541  TempSrc:   PainSc: 0-No pain      Patients Stated Pain Goal: 0 (07/13/17 1354)  Complications: No apparent anesthesia complications

## 2017-07-14 NOTE — Anesthesia Postprocedure Evaluation (Signed)
Anesthesia Post Note  Patient: Kaitlyn LombardMary Janet Lewis  Procedure(s) Performed: ESOPHAGOGASTRODUODENOSCOPY (EGD) (N/A ) COLONOSCOPY (N/A )  Patient location during evaluation: Endoscopy Anesthesia Type: General Level of consciousness: awake and alert Pain management: pain level controlled Vital Signs Assessment: post-procedure vital signs reviewed and stable Respiratory status: spontaneous breathing, nonlabored ventilation, respiratory function stable and patient connected to nasal cannula oxygen Cardiovascular status: blood pressure returned to baseline and stable Postop Assessment: no apparent nausea or vomiting Anesthetic complications: no     Last Vitals:  Vitals:   07/14/17 1602 07/14/17 1611  BP: (!) 142/73 (!) 146/69  Pulse: 70 77  Resp: 11 18  Temp:    SpO2: 97% 97%    Last Pain:  Vitals:   07/14/17 1611  TempSrc:   PainSc: 0-No pain                 Kynedi Profitt S

## 2017-07-14 NOTE — Op Note (Signed)
[facility] Gastroenterology Patient Name: Kaitlyn Lewis Procedure Date: 07/14/2017 2:34 PM MRN: [id number] Account #: [id number] Date of Birth: December 25, 1940 Admit Type: Inpatient Age: 77 Room: [facility] ENDO ROOM 4 Gender: Female Note Status: Finalized Procedure:            Colonoscopy Indications:          Lower abdominal pain, Diarrhea (secondary to                        noninfectious colitis), Abnormal CT of the GI tract Providers:            Lin Landsman MD, MD Medicines:            Monitored Anesthesia Care Complications:        No immediate complications. Estimated blood loss:                        Minimal. Procedure:            Pre-Anesthesia Assessment:                       - Prior to the procedure, a History and Physical was                        performed, and patient medications and allergies were                        reviewed. The patient is competent. The risks and                        benefits of the procedure and the sedation options and                        risks were discussed with the patient. All questions                        were answered and informed consent was obtained.                        Patient identification and proposed procedure were                        verified by the physician, the nurse, the                        anesthesiologist, the anesthetist and the technician in                        the pre-procedure area in the procedure room in the                        endoscopy suite. Mental Status Examination: alert and                        oriented. Airway Examination: normal oropharyngeal                        airway and neck mobility. Respiratory Examination:                        clear  to auscultation. CV Examination: normal.                        Prophylactic Antibiotics: The patient does not require                        prophylactic antibiotics. Prior Anticoagulants: The                        patient  has taken previous NSAID medication. ASA Grade                        Assessment: III - A patient with severe systemic                        disease. After reviewing the risks and benefits, the                        patient was deemed in satisfactory condition to undergo                        the procedure. The anesthesia plan was to use monitored                        anesthesia care (MAC). Immediately prior to                        administration of medications, the patient was                        re-assessed for adequacy to receive sedatives. The                        heart rate, respiratory rate, oxygen saturations, blood                        pressure, adequacy of pulmonary ventilation, and                        response to care were monitored throughout the                        procedure. The physical status of the patient was                        re-assessed after the procedure.                       After obtaining informed consent, the colonoscope was                        passed under direct vision. Throughout the procedure,                        the patient's blood pressure, pulse, and oxygen                        saturations were monitored continuously. The                        Colonoscope was introduced through the  anus and                        advanced to the the terminal ileum. The colonoscopy was                        performed without difficulty. The patient tolerated the                        procedure well. The quality of the bowel preparation                        was fair. Findings:      The perianal and digital rectal examinations were normal. Pertinent       negatives include normal sphincter tone and no palpable rectal lesions.      The terminal ileum appeared normal.      A continuous area of nonbleeding ulcerated mucosa with stigmata of       recent bleeding was present in the sigmoid colon extending from 15cm to       40cm from anal  verge c/w ischemic colitis. Biopsies were taken with a       cold forceps for histology.      The rectum, recto-sigmoid colon, descending colon, transverse colon,       ascending colon and cecum appeared normal. Biopsies were taken with a       cold forceps for histology.      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 There was       narrowing of the colon in association with the diverticular opening.       Peri-diverticular erythema was seen. There was no evidence of       diverticular bleeding. Impression:           - Preparation of the colon was fair.                       - The examined portion of the ileum was normal.                       - Mucosal ulceration. Biopsied.                       - The rectum, recto-sigmoid colon, descending colon,                        transverse colon, ascending colon and cecum are normal.                        Biopsied. Recommendation:       - Return patient to hospital ward for possible                        discharge same day.                       - Advance diet as tolerated today.                       - Continue present medications.                       - No ibuprofen, naproxen, or other non-steroidal  anti-inflammatory drugs.                       - Await pathology results.                       - Return to my office in 2 weeks. Procedure Code(s):    --- Professional ---                       [phone], Colonoscopy, flexible; with biopsy, single or                        multiple Diagnosis Code(s):    --- Professional ---                       K63.3, Ulcer of intestine                       R10.30, Lower abdominal pain, unspecified                       K52.9, Noninfective gastroenteritis and colitis,                        unspecified                       R93.3, Abnormal findings on diagnostic imaging of other                        parts of digestive tract CPT copyright 2017 American Medical Association.  All rights reserved. The codes documented in this report are preliminary and upon coder review may  be revised to meet current compliance requirements. Dr. Ulyess Mort Lin Landsman MD, MD 07/14/2017 3:35:33 PM This report has been signed electronically. Number of Addenda: 0 Note Initiated On: 07/14/2017 2:34 PM Scope Withdrawal Time: 0 hours 13 minutes 25 seconds  Total Procedure Duration: 0 hours 23 minutes 36 seconds       [facility]

## 2017-07-14 NOTE — Anesthesia Post-op Follow-up Note (Signed)
Anesthesia QCDR form completed.        

## 2017-07-15 ENCOUNTER — Encounter: Payer: Self-pay | Admitting: Gastroenterology

## 2017-07-15 NOTE — Discharge Summary (Signed)
[facility] [place] [facility]   PATIENT NAME: Kaitlyn Lewis    MR#:  [id number]  DATE OF BIRTH:  [birth year]  DATE OF ADMISSION:  07/11/2017 ADMITTING PHYSICIAN: Enid Baas, MD  DATE OF DISCHARGE: 07/14/2017  7:01 PM  PRIMARY CARE PHYSICIAN: Baltazar Najjar, MD    ADMISSION DIAGNOSIS:  Colitis [K52.9]  DISCHARGE DIAGNOSIS:  Active Problems:   Acute colitis   SECONDARY DIAGNOSIS:   Past Medical History:  Diagnosis Date  . Diabetes mellitus without complication (HCC)   . GERD (gastroesophageal reflux disease)   . Hyperlipemia   . Hypertension   . Thyroid disease     HOSPITAL COURSE:   77 year old female with past medical history of essential hypertension, hyperlipidemia, GERD, diabetes, hypothyroidism who presented to the hospital due to abdominal pain, bloody stools and noted to have CT scan findings suggestive of colitis.  1. Colitis- Patient presented with abdominal pain and bloody stool and CT scan findings suggestive of colitis.  Patient stool for C. difficile and gastrointestinal PCR are negative. -Patient was treated supportively with IV fluids, antiemetics, with IV ciprofloxacin and Flagyl.  Gastroenterology consult was obtained and patient subsequently underwent a colonoscopy which shows evidence of mild ischemic colitis. -With supportive care patient's symptoms have improved.  She has had no further bloody stools her hemoglobin is stable she is tolerating a regular diet well.  She is therefore being empirically discharged on a 7-day course of ciprofloxacin and Flagyl.  She will follow-up with gastroenterology in the next 1-2 weeks.  2.  Hypothyroidism- she will continue Synthroid.  3. DM - pt. Will resume her Humulin 70/30 and metformin upon discharge.  - BS stable while in the hospital.   4. DM Neuropathy - pt. Will cont. Neurontin.   5. Hyperlipidemia - pt. Will cont. Atorvastatin  6. GERD - pt. Will cont. Her Omeprazole.   7.  Depression - pt. Will cont. Effexor  8. Essential HTN - pt. Will cont. Propranolol and enalapril.      DISCHARGE CONDITIONS:   Stable.   CONSULTS OBTAINED:    DRUG ALLERGIES:  No Known Allergies  DISCHARGE MEDICATIONS:   Allergies as of 07/14/2017   No Known Allergies     Medication List    TAKE these medications   amitriptyline 25 MG tablet Commonly known as:  ELAVIL Take 50 mg by mouth at bedtime.   aspirin EC 81 MG tablet Take 81 mg by mouth daily.   atorvastatin 40 MG tablet Commonly known as:  LIPITOR Take 40 mg by mouth at bedtime.   Calcium Carbonate-Vitamin D 600-400 MG-UNIT tablet Take 1 tablet by mouth daily.   ciprofloxacin 250 MG tablet Commonly known as:  CIPRO Take 1 tablet (250 mg total) by mouth 2 (two) times daily for 7 days.   enalapril 5 MG tablet Commonly known as:  VASOTEC Take 5 mg by mouth at bedtime.   ferrous sulfate 325 (65 FE) MG tablet Take 1 tablet (325 mg total) by mouth daily with breakfast.   gabapentin 600 MG tablet Commonly known as:  NEURONTIN Take 600 mg by mouth 2 (two) times daily.   HUMULIN 70/30 KWIKPEN (70-30) 100 UNIT/ML PEN Generic drug:  Insulin Isophane & Regular Human Inject 32-36 Units into the skin 2 (two) times daily. Inject 36 units in the morning and 32 units at night.   hydrOXYzine 25 MG tablet Commonly known as:  ATARAX/VISTARIL Take 25 mg by mouth 2 (two) times daily.   levothyroxine 88  MCG tablet Commonly known as:  SYNTHROID, LEVOTHROID Take 88 mcg by mouth daily.   LORazepam 0.5 MG tablet Commonly known as:  ATIVAN Take 0.5 mg by mouth daily as needed for anxiety or sleep.   magnesium oxide 400 MG tablet Commonly known as:  MAG-OX Take 400 mg by mouth 2 (two) times daily.   MELATONIN PO Take 1 tablet by mouth at bedtime.   metFORMIN 500 MG 24 hr tablet Commonly known as:  GLUCOPHAGE-XR Take 500 mg by mouth 2 (two) times daily.   metroNIDAZOLE 500 MG tablet Commonly known as:   FLAGYL Take 1 tablet (500 mg total) by mouth 3 (three) times daily for 7 days.   omeprazole 20 MG capsule Commonly known as:  PRILOSEC Take 20 mg by mouth daily.   PROAIR HFA 108 (90 Base) MCG/ACT inhaler Generic drug:  albuterol Inhale 2 puffs into the lungs every 4 (four) hours as needed for shortness of breath.   propranolol 20 MG tablet Commonly known as:  INDERAL Take 20 mg by mouth 2 (two) times daily.   venlafaxine XR 75 MG 24 hr capsule Commonly known as:  EFFEXOR-XR Take 75 mg by mouth daily.   vitamin B-12 1000 MCG tablet Commonly known as:  CYANOCOBALAMIN Take 1,000 mcg by mouth daily.         DISCHARGE INSTRUCTIONS:   DIET:  Cardiac diet and Diabetic diet  DISCHARGE CONDITION:  Stable  ACTIVITY:  Activity as tolerated  OXYGEN:  Home Oxygen: No.   Oxygen Delivery: room air  DISCHARGE LOCATION:  home   If you experience worsening of your admission symptoms, develop shortness of breath, life threatening emergency, suicidal or homicidal thoughts you must seek medical attention immediately by calling 911 or calling your MD immediately  if symptoms less severe.  You Must read complete instructions/literature along with all the possible adverse reactions/side effects for all the Medicines you take and that have been prescribed to you. Take any new Medicines after you have completely understood and accpet all the possible adverse reactions/side effects.   Please note  You were cared for by a hospitalist during your hospital stay. If you have any questions about your discharge medications or the care you received while you were in the hospital after you are discharged, you can call the unit and asked to speak with the hospitalist on call if the hospitalist that took care of you is not available. Once you are discharged, your primary care physician will handle any further medical issues. Please note that NO REFILLS for any discharge medications will be  authorized once you are discharged, as it is imperative that you return to your primary care physician (or establish a relationship with a primary care physician if you do not have one) for your aftercare needs so that they can reassess your need for medications and monitor your lab values.   DATA REVIEW:   CBC Recent Labs  Lab 07/13/17 0555  WBC 9.4  HGB 9.9*  HCT 29.5*  PLT 150    Chemistries  Recent Labs  Lab 07/11/17 0911  07/13/17 0555  NA 131*   < > 135  K 4.0   < > 3.9  CL 97*   < > 104  CO2 24   < > 26  GLUCOSE 240*   < > 178*  BUN 29*   < > 15  CREATININE 1.29*   < > 1.22*  CALCIUM 8.7*   < > 8.3*  AST 35  --   --  ALT 19  --   --   ALKPHOS 92  --   --   BILITOT 0.9  --   --    < > = values in this interval not displayed.    Cardiac Enzymes Recent Labs  Lab 07/11/17 0911  TROPONINI <0.03    Microbiology Results  Results for orders placed or performed during the hospital encounter of 07/11/17  C difficile quick scan w PCR reflex     Status: None   Collection Time: 07/11/17  8:51 AM  Result Value Ref Range Status   C Diff antigen NEGATIVE NEGATIVE Final   C Diff toxin NEGATIVE NEGATIVE Final   C Diff interpretation No C. difficile detected.  Final    Comment: VALID Performed at [facility], [address] [place], [place] [address]   Gastrointestinal Panel by PCR , Stool     Status: None   Collection Time: 07/11/17  8:51 AM  Result Value Ref Range Status   Campylobacter species NOT DETECTED NOT DETECTED Final   Plesimonas shigelloides NOT DETECTED NOT DETECTED Final   Salmonella species NOT DETECTED NOT DETECTED Final   Yersinia enterocolitica NOT DETECTED NOT DETECTED Final   Vibrio species NOT DETECTED NOT DETECTED Final   Vibrio cholerae NOT DETECTED NOT DETECTED Final   Enteroaggregative E coli (EAEC) NOT DETECTED NOT DETECTED Final   Enteropathogenic E coli (EPEC) NOT DETECTED NOT DETECTED Final   Enterotoxigenic E coli (ETEC)  NOT DETECTED NOT DETECTED Final   Shiga like toxin producing E coli (STEC) NOT DETECTED NOT DETECTED Final   Shigella/Enteroinvasive E coli (EIEC) NOT DETECTED NOT DETECTED Final   Cryptosporidium NOT DETECTED NOT DETECTED Final   Cyclospora cayetanensis NOT DETECTED NOT DETECTED Final   Entamoeba histolytica NOT DETECTED NOT DETECTED Final   Giardia lamblia NOT DETECTED NOT DETECTED Final   Adenovirus F40/41 NOT DETECTED NOT DETECTED Final   Astrovirus NOT DETECTED NOT DETECTED Final   Norovirus GI/GII NOT DETECTED NOT DETECTED Final   Rotavirus A NOT DETECTED NOT DETECTED Final   Sapovirus (I, II, IV, and V) NOT DETECTED NOT DETECTED Final    Comment: Performed at [facility], [address] [place], [place] [address]    RADIOLOGY:  No results found.    Management plans discussed with the patient, family and they are in agreement.  CODE STATUS:  Code Status History    Date Active Date Inactive Code Status Order ID Comments User Context   07/11/2017 1439 07/14/2017 2206 Full Code [id number]  Enid Baas, MD Inpatient      TOTAL TIME TAKING CARE OF THIS PATIENT: 40 minutes.    Houston Siren M.D on 07/15/2017 at 2:43 PM  Between 7am to 6pm - Pager - [phone]  After 6pm go to [email or web address] - Social research officer, government  [facility]  Office  [phone]  CC: Primary care physician; Baltazar Najjar, MD

## 2017-07-16 LAB — SURGICAL PATHOLOGY

## 2017-08-12 ENCOUNTER — Emergency Department: Payer: Medicare Other

## 2017-08-12 ENCOUNTER — Observation Stay
Admission: EM | Admit: 2017-08-12 | Discharge: 2017-08-14 | Disposition: A | Discharge status: Home / Self Care | Payer: Medicare Other | Attending: Internal Medicine | Admitting: Internal Medicine

## 2017-08-12 DIAGNOSIS — Z7982 Long term (current) use of aspirin: Secondary | ICD-10-CM | POA: Insufficient documentation

## 2017-08-12 DIAGNOSIS — E785 Hyperlipidemia, unspecified: Secondary | ICD-10-CM | POA: Diagnosis not present

## 2017-08-12 DIAGNOSIS — K219 Gastro-esophageal reflux disease without esophagitis: Secondary | ICD-10-CM | POA: Diagnosis not present

## 2017-08-12 DIAGNOSIS — X58XXXA Exposure to other specified factors, initial encounter: Secondary | ICD-10-CM | POA: Diagnosis not present

## 2017-08-12 DIAGNOSIS — E11649 Type 2 diabetes mellitus with hypoglycemia without coma: Secondary | ICD-10-CM | POA: Diagnosis not present

## 2017-08-12 DIAGNOSIS — I160 Hypertensive urgency: Secondary | ICD-10-CM | POA: Insufficient documentation

## 2017-08-12 DIAGNOSIS — Z794 Long term (current) use of insulin: Secondary | ICD-10-CM | POA: Insufficient documentation

## 2017-08-12 DIAGNOSIS — M810 Age-related osteoporosis without current pathological fracture: Secondary | ICD-10-CM | POA: Insufficient documentation

## 2017-08-12 DIAGNOSIS — T68XXXA Hypothermia, initial encounter: Secondary | ICD-10-CM | POA: Diagnosis not present

## 2017-08-12 DIAGNOSIS — Z79890 Hormone replacement therapy (postmenopausal): Secondary | ICD-10-CM | POA: Diagnosis not present

## 2017-08-12 DIAGNOSIS — Z79899 Other long term (current) drug therapy: Secondary | ICD-10-CM | POA: Insufficient documentation

## 2017-08-12 DIAGNOSIS — E119 Type 2 diabetes mellitus without complications: Secondary | ICD-10-CM

## 2017-08-12 DIAGNOSIS — E039 Hypothyroidism, unspecified: Secondary | ICD-10-CM | POA: Insufficient documentation

## 2017-08-12 DIAGNOSIS — E162 Hypoglycemia, unspecified: Secondary | ICD-10-CM

## 2017-08-12 DIAGNOSIS — I10 Essential (primary) hypertension: Secondary | ICD-10-CM | POA: Insufficient documentation

## 2017-08-12 DIAGNOSIS — A419 Sepsis, unspecified organism: Secondary | ICD-10-CM

## 2017-08-12 LAB — COMPREHENSIVE METABOLIC PANEL
ALK PHOS: 58 U/L (ref 38–126)
ALT: 26 U/L (ref 14–54)
ANION GAP: 11 (ref 5–15)
AST: 34 U/L (ref 15–41)
Albumin: 5 g/dL (ref 3.5–5.0)
BILIRUBIN TOTAL: 1 mg/dL (ref 0.3–1.2)
BUN: 22 mg/dL — ABNORMAL HIGH (ref 6–20)
CALCIUM: 9.6 mg/dL (ref 8.9–10.3)
CO2: 26 mmol/L (ref 22–32)
Chloride: 100 mmol/L — ABNORMAL LOW (ref 101–111)
Creatinine, Ser: 1.16 mg/dL — ABNORMAL HIGH (ref 0.44–1.00)
GFR calc Af Amer: 52 mL/min — ABNORMAL LOW (ref >60)
GFR calc non Af Amer: 45 mL/min — ABNORMAL LOW (ref >60)
Glucose, Bld: 98 mg/dL (ref 65–99)
Potassium: 4.1 mmol/L (ref 3.5–5.1)
Sodium: 137 mmol/L (ref 135–145)
TOTAL PROTEIN: 8.4 g/dL — AB (ref 6.5–8.1)

## 2017-08-12 LAB — GLUCOSE, CAPILLARY
GLUCOSE-CAPILLARY: 41 mg/dL — AB (ref 65–99)
GLUCOSE-CAPILLARY: 99 mg/dL (ref 65–99)
Glucose-Capillary: 42 mg/dL — CL (ref 65–99)

## 2017-08-12 LAB — LACTIC ACID, PLASMA: Lactic Acid, Venous: 1.5 mmol/L (ref 0.5–1.9)

## 2017-08-12 LAB — CBC WITH DIFFERENTIAL/PLATELET
BASOS ABS: 0 10*3/uL (ref 0–0.1)
BASOS PCT: 0 %
EOS ABS: 0.2 10*3/uL (ref 0–0.7)
Eosinophils Relative: 2 %
HEMATOCRIT: 43.8 % (ref 35.0–47.0)
Hemoglobin: 14.2 g/dL (ref 12.0–16.0)
Lymphocytes Relative: 15 %
Lymphs Abs: 1.5 10*3/uL (ref 1.0–3.6)
MCH: 27.9 pg (ref 26.0–34.0)
MCHC: 32.4 g/dL (ref 32.0–36.0)
MCV: 86 fL (ref 80.0–100.0)
MONO ABS: 0.8 10*3/uL (ref 0.2–0.9)
MONOS PCT: 9 %
NEUTROS ABS: 7.4 10*3/uL — AB (ref 1.4–6.5)
Neutrophils Relative %: 74 %
PLATELETS: 205 10*3/uL (ref 150–440)
RBC: 5.09 MIL/uL (ref 3.80–5.20)
RDW: 23.4 % — ABNORMAL HIGH (ref 11.5–14.5)
WBC: 10 10*3/uL (ref 3.6–11.0)

## 2017-08-12 LAB — TROPONIN I

## 2017-08-12 MED ORDER — GLUCOSE 40 % PO GEL
1.0000 | Freq: Once | ORAL | Status: AC
Start: 2017-08-12 — End: 2017-08-12
  Administered 2017-08-12: 1 via ORAL

## 2017-08-12 MED ORDER — DEXTROSE 50 % IV SOLN
1.0000 | Freq: Once | INTRAVENOUS | Status: DC
  Administered 2017-08-12: 50 mL via INTRAVENOUS

## 2017-08-12 MED ORDER — PROPRANOLOL HCL 20 MG PO TABS
20.0000 mg | ORAL_TABLET | Freq: Once | ORAL | Status: AC
  Administered 2017-08-12: 20 mg via ORAL
  Filled 2017-08-12: qty 1

## 2017-08-12 MED ORDER — GLUCAGON HCL (RDNA) 1 MG IJ SOLR
1.0000 mg | Freq: Once | INTRAMUSCULAR | Status: DC
  Filled 2017-08-12: qty 1

## 2017-08-12 MED ORDER — DEXTROSE 50 % IV SOLN
INTRAVENOUS | Status: AC
  Administered 2017-08-12: 50 mL via INTRAVENOUS
  Filled 2017-08-12: qty 50

## 2017-08-12 MED ORDER — DEXTROSE-NACL 5-0.9 % IV SOLN
INTRAVENOUS | Status: DC
  Administered 2017-08-13: 01:00:00 via INTRAVENOUS
  Filled 2017-08-12: qty 1000

## 2017-08-12 MED ORDER — GLUCOSE 40 % PO GEL
ORAL | Status: AC
  Administered 2017-08-12: 1 via ORAL
  Filled 2017-08-12: qty 1

## 2017-08-12 MED ORDER — SODIUM CHLORIDE 0.9 % IV SOLN
1.0000 g | Freq: Once | INTRAVENOUS | Status: AC
  Administered 2017-08-12: 1 g via INTRAVENOUS
  Filled 2017-08-12: qty 1

## 2017-08-12 MED ORDER — ENALAPRIL MALEATE 10 MG PO TABS
5.0000 mg | ORAL_TABLET | Freq: Once | ORAL | Status: AC
  Administered 2017-08-12: 5 mg via ORAL
  Filled 2017-08-12: qty 1

## 2017-08-12 MED ORDER — GLUCAGON HCL RDNA (DIAGNOSTIC) 1 MG IJ SOLR
INTRAMUSCULAR | Status: AC
  Filled 2017-08-12: qty 1

## 2017-08-12 MED ORDER — VANCOMYCIN HCL IN DEXTROSE 1-5 GM/200ML-% IV SOLN
1000.0000 mg | Freq: Once | INTRAVENOUS | Status: AC
  Administered 2017-08-12: 1000 mg via INTRAVENOUS
  Filled 2017-08-12: qty 200

## 2017-08-12 NOTE — ED Provider Notes (Addendum)
[facility] Emergency Department Provider Note  ____________________________________________  Time seen: Approximately 11:40 PM  I have reviewed the triage vital signs and the nursing notes.   HISTORY  Chief Complaint Hypoglycemia and Hypertension  Level 5 caveat:  Portions of the history and physical were unable to be obtained due to AMS   HPI Kaitlyn Lewis is a 77 y.o. female with a history of diabetes on Lantus, hypertension, hyperlipidemia, hypothyroidism who presents from home for altered mental status.  According to the family patient is usually very independent, alert and oriented x4 at baseline.  Today after dinner she was found to be progressively more somnolent and confused at home which prompted EMS to be called.  When EMS arrived the patient was hypoglycemic with sugar of 29.  EMS was unable to establish an IV and patient did not receive any treatment in route.  Patient arrives lethargic and somnolent but easily arousable and following commands, she is not speaking. She shakes her head no when asked if she has CP, SOB, HA, nausea, or fever.  According to the family patient usually administer her own medications at home.  She is compliant with them.  He denies any trauma.  The denied any recent illness.  He reports that her symptoms were acute in onset and severe.  Past Medical History:  Diagnosis Date  . Diabetes mellitus without complication (HCC)   . GERD (gastroesophageal reflux disease)   . Hyperlipemia   . Hypertension   . Thyroid disease     Patient Active Problem List   Diagnosis Date Noted  . Acute colitis 07/11/2017    Past Surgical History:  Procedure Laterality Date  . ABDOMINAL HYSTERECTOMY    . BRAIN SURGERY     benign tumor removal  . CHOLECYSTECTOMY    . COLONOSCOPY N/A 07/14/2017   Procedure: COLONOSCOPY;  Surgeon: Toney Reil, MD;  Location: [facility] ENDOSCOPY;  Service: Gastroenterology;  Laterality: N/A;  .  ESOPHAGOGASTRODUODENOSCOPY N/A 07/14/2017   Procedure: ESOPHAGOGASTRODUODENOSCOPY (EGD);  Surgeon: Toney Reil, MD;  Location: [facility] ENDOSCOPY;  Service: Gastroenterology;  Laterality: N/A;  . THYROIDECTOMY      Prior to Admission medications   Medication Sig Start Date End Date Taking? Authorizing Provider  amitriptyline (ELAVIL) 25 MG tablet Take 50 mg by mouth at bedtime. 05/30/17  Yes [provider]  aspirin EC 81 MG tablet Take 81 mg by mouth daily.   Yes [provider]  atorvastatin (LIPITOR) 40 MG tablet Take 40 mg by mouth at bedtime. 04/30/17  Yes [provider]  Calcium Carbonate-Vitamin D 600-400 MG-UNIT tablet Take 1 tablet by mouth daily. 07/27/08  Yes [provider]  enalapril (VASOTEC) 5 MG tablet Take 5 mg by mouth at bedtime. 07/03/17  Yes [provider]  ferrous sulfate 325 (65 FE) MG tablet Take 1 tablet (325 mg total) by mouth daily with breakfast. 07/15/17  Yes Sainani, Rolly Pancake, MD  gabapentin (NEURONTIN) 600 MG tablet Take 600 mg by mouth 2 (two) times daily. 06/06/17  Yes [provider]  HUMULIN 70/30 KWIKPEN (70-30) 100 UNIT/ML PEN Inject 32-36 Units into the skin 2 (two) times daily. Inject 36 units in the morning and 32 units at night. 06/25/17  Yes [provider]  levothyroxine (SYNTHROID, LEVOTHROID) 88 MCG tablet Take 88 mcg by mouth daily. 05/20/17  Yes [provider]  LORazepam (ATIVAN) 0.5 MG tablet Take 0.5 mg by mouth daily as needed for anxiety or sleep. 04/25/17  Yes [provider]  magnesium oxide (MAG-OX) 400 MG tablet Take 400 mg by mouth 2 (two) times daily. 07/28/14  Yes [provider]  metFORMIN (GLUCOPHAGE-XR) 500 MG 24 hr tablet Take 500 mg by mouth 2 (two) times daily. 05/10/17  Yes [provider]  omeprazole (PRILOSEC) 20 MG capsule Take 20 mg by mouth daily. 04/24/17  Yes [provider]  potassium chloride (K-DUR) 10 MEQ tablet Take 10 mEq by  mouth daily.   Yes [provider]  PROAIR HFA 108 (90 Base) MCG/ACT inhaler Inhale 2 puffs into the lungs every 4 (four) hours as needed for shortness of breath. 04/24/17  Yes [provider]  propranolol (INDERAL) 20 MG tablet Take 20 mg by mouth 2 (two) times daily. 06/06/17  Yes [provider]  tiZANidine (ZANAFLEX) 4 MG tablet Take 2 mg by mouth 2 (two) times daily.   Yes [provider]  venlafaxine XR (EFFEXOR-XR) 75 MG 24 hr capsule Take 75 mg by mouth 2 (two) times daily.    Yes [provider]  vitamin B-12 (CYANOCOBALAMIN) 1000 MCG tablet Take 1,000 mcg by mouth daily.   Yes [provider]    Allergies Patient has no known allergies.  Family History  Problem Relation Age of Onset  . CAD Brother     Social History Social History   Tobacco Use  . Smoking status: Never Smoker  . Smokeless tobacco: Never Used  Substance Use Topics  . Alcohol use: Never    Frequency: Never  . Drug use: Never    Review of Systems  Constitutional: Negative for fever. + AMS Cardiovascular: Negative for chest pain. Respiratory: Negative for shortness of breath. Gastrointestinal: Negative for abdominal pain, vomiting or diarrhea. Genitourinary: Negative for dysuria. Musculoskeletal: Negative for back pain.  ____________________________________________   PHYSICAL EXAM:  VITAL SIGNS: ED Triage Vitals  Enc Vitals Group     BP 08/12/17 2130 (!) 231/100     Pulse Rate 08/12/17 2130 71     Resp 08/12/17 2145 (!) 29     Temp 08/12/17 2153 (!) 90.4 F (32.4 C)     Temp Source 08/12/17 2156 Rectal     SpO2 08/12/17 2130 96 %     Weight --      Height --      Head Circumference --      Peak Flow --      Pain Score --      Pain Loc --      Pain Edu? --      Excl. in GC? --     Constitutional: Sleepy, arousable to name, follows commands, not speaking HEENT:      Head: Normocephalic and atraumatic.         Eyes: Conjunctivae  are normal. Sclera is non-icteric.       Mouth/Throat: Mucous membranes are dry.       Neck: Supple with no signs of meningismus. Cardiovascular: Regular rate and rhythm. No murmurs, gallops, or rubs. 2+ symmetrical distal pulses are present in all extremities. No JVD. Respiratory: Normal respiratory effort. Lungs are clear to auscultation bilaterally. No wheezes, crackles, or rhonchi.  Gastrointestinal: Soft, non tender, and non distended with positive bowel sounds. No rebound or guarding. Musculoskeletal: Nontender with normal range of motion in all extremities. No edema, cyanosis, or erythema of extremities. Neurologic: Not speaking but mouthing answers, oriented x 2. Face is symmetric. Moving all extremities. Follows commands. Skin: Skin is warm, dry and intact.  No rash noted.   ____________________________________________   LABS (all labs ordered are listed, but only abnormal results are displayed)  Labs Reviewed  GLUCOSE, CAPILLARY - Abnormal; Notable for the following components:      Result Value   Glucose-Capillary 29 (*)    All other components within normal limits  GLUCOSE, CAPILLARY - Abnormal; Notable for the following components:   Glucose-Capillary 41 (*)    All other components within normal limits  CBC WITH DIFFERENTIAL/PLATELET - Abnormal; Notable for the following components:   RDW 23.4 (*)    Neutro Abs 7.4 (*)    All other components within normal limits  GLUCOSE, CAPILLARY - Abnormal; Notable for the following components:   Glucose-Capillary 42 (*)    All other components within normal limits  COMPREHENSIVE METABOLIC PANEL - Abnormal; Notable for the following components:   Chloride 100 (*)    BUN 22 (*)    Creatinine, Ser 1.16 (*)    Total Protein 8.4 (*)    GFR calc non Af Amer 45 (*)    GFR calc Af Amer 52 (*)    All other components within normal limits  CULTURE, BLOOD (ROUTINE X 2)  CULTURE, BLOOD (ROUTINE X 2)  TROPONIN I  LACTIC ACID, PLASMA    GLUCOSE, CAPILLARY  URINALYSIS, COMPLETE (UACMP) WITH MICROSCOPIC  URINALYSIS, ROUTINE W REFLEX MICROSCOPIC  LACTIC ACID, PLASMA   ____________________________________________  EKG  ED ECG REPORT I, Nita Sickle, the attending physician, personally viewed and interpreted this ECG.  Normal sinus rhythm, rate of 68, incomplete left bundle branch block, prolonged QTC, left axis deviation, no ST elevations or depressions. No prior for comparison  ____________________________________________  RADIOLOGY  I have personally reviewed the images performed during this visit and I agree with the Radiologist's read.   Interpretation by Radiologist:  Dg Chest Port 1 View  Result Date: 08/12/2017 CLINICAL DATA:  Altered mental status, hypoglycemic. EXAM: PORTABLE CHEST 1 VIEW COMPARISON:  Chest radiograph July 03, 2014 FINDINGS: Cardiomediastinal silhouette is normal. No pleural effusions or focal consolidations. RIGHT lung base strandy densities. Trachea projects midline and there is no pneumothorax. Soft tissue planes and included osseous structures are non-suspicious. Surgical clips in the included right abdomen compatible with cholecystectomy. IMPRESSION: RIGHT lung base atelectasis/scarring. Electronically Signed   By: Awilda Metro M.D.   On: 08/12/2017 22:32     ____________________________________________   PROCEDURES  Procedure(s) performed: None Procedures Critical Care performed:  Yes  CRITICAL CARE Performed by: Nita Sickle  ?  Total critical care time: 30 min  Critical care time was exclusive of separately billable procedures and treating other patients.  Critical care was necessary to treat or prevent imminent or life-threatening deterioration.  Critical care was time spent personally by me on the following activities: development of treatment plan with patient and/or surrogate as well as nursing, discussions with consultants, evaluation of patient's  response to treatment, examination of patient, obtaining history from patient or surrogate, ordering and performing treatments and interventions, ordering and review of laboratory studies, ordering and review of radiographic studies, pulse oximetry and re-evaluation of patient's condition.  ____________________________________________   INITIAL IMPRESSION / ASSESSMENT AND PLAN / ED COURSE  77 y.o. female with a history of diabetes on Lantus, hypertension, hyperlipidemia, hypothyroidism who presents from home for altered mental status.  Patient arrives hypoglycemic and hypothermic.  She received oral glucose while IV was being established.  She then received a bolus of D50.  Her altered mental status immediately resolved and  patient was back to her baseline with GCS of 15, alert and oriented x3, neurologically intact.  Her EKG shows no evidence of ischemia or arrhythmias.  Patient significantly hypertensive however has not taken her p.m. medications which were given to her here.  Due to hypoglycemia and hypothermia patient was made a code sepsis and was started on cefepime and vancomycin.  She was started on D5 normal bolus.  Her labs showed normal lactic of 1.5, normal white count, normal hemoglobin, negative troponin, and CMP within patient's baseline.  Chest x-ray with no evidence of pneumonia.  UA pending to rule out UTI.  Patient's can be admitted to the hospitalist service.      As part of my medical decision making, I reviewed the following data within the electronic MEDICAL RECORD NUMBER History obtained from family, Nursing notes reviewed and incorporated, Labs reviewed , EKG interpreted , Old chart reviewed, Radiograph reviewed , Discussed with admitting physician , Notes from prior ED visits and [place] Controlled Substance Database    Pertinent labs & imaging results that were available during my care of the patient were reviewed by me and considered in my medical decision making (see chart for  details).    ____________________________________________   FINAL CLINICAL IMPRESSION(S) / ED DIAGNOSES  Final diagnoses:  Sepsis, due to unspecified organism (HCC)  Hypoglycemia  Hypothermia, initial encounter      NEW MEDICATIONS STARTED DURING THIS VISIT:  ED Discharge Orders    None       Note:  This document was prepared using Dragon voice recognition software and may include unintentional dictation errors.    Don Perking, [place], MD 08/12/17 6962    Nita Sickle, MD 09/03/17 [phone]

## 2017-08-12 NOTE — ED Triage Notes (Signed)
Pt comes from home, EMS report CBG of 22. No treatment for hypoglycemia administered by EMS. Pt also is hypertensive.

## 2017-08-12 NOTE — Progress Notes (Signed)
CODE SEPSIS - PHARMACY COMMUNICATION  **Broad Spectrum Antibiotics should be administered within 1 hour of Sepsis diagnosis**  Time Code Sepsis Called/Page Received: 2158  Antibiotics Ordered: vanc/cefepime  Time of 1st antibiotic administration: 2317  Additional action taken by pharmacy:   If necessary, Name of Provider/Nurse Contacted:     Thomasene Ripple ,PharmD Clinical Pharmacist  08/12/2017  11:57 PM

## 2017-08-13 ENCOUNTER — Other Ambulatory Visit: Payer: Self-pay

## 2017-08-13 DIAGNOSIS — E785 Hyperlipidemia, unspecified: Secondary | ICD-10-CM | POA: Diagnosis present

## 2017-08-13 DIAGNOSIS — I10 Essential (primary) hypertension: Secondary | ICD-10-CM | POA: Diagnosis present

## 2017-08-13 DIAGNOSIS — E039 Hypothyroidism, unspecified: Secondary | ICD-10-CM | POA: Diagnosis present

## 2017-08-13 DIAGNOSIS — K219 Gastro-esophageal reflux disease without esophagitis: Secondary | ICD-10-CM | POA: Diagnosis present

## 2017-08-13 DIAGNOSIS — E11649 Type 2 diabetes mellitus with hypoglycemia without coma: Secondary | ICD-10-CM | POA: Diagnosis not present

## 2017-08-13 DIAGNOSIS — E119 Type 2 diabetes mellitus without complications: Secondary | ICD-10-CM

## 2017-08-13 LAB — GLUCOSE, CAPILLARY
GLUCOSE-CAPILLARY: 109 mg/dL — AB (ref 65–99)
GLUCOSE-CAPILLARY: 112 mg/dL — AB (ref 65–99)
GLUCOSE-CAPILLARY: 147 mg/dL — AB (ref 65–99)
GLUCOSE-CAPILLARY: 189 mg/dL — AB (ref 65–99)
GLUCOSE-CAPILLARY: 273 mg/dL — AB (ref 65–99)
GLUCOSE-CAPILLARY: 275 mg/dL — AB (ref 65–99)
Glucose-Capillary: 167 mg/dL — ABNORMAL HIGH (ref 65–99)
Glucose-Capillary: 202 mg/dL — ABNORMAL HIGH (ref 65–99)

## 2017-08-13 LAB — URINALYSIS, COMPLETE (UACMP) WITH MICROSCOPIC
BACTERIA UA: NONE SEEN
Bilirubin Urine: NEGATIVE
Glucose, UA: 50 mg/dL — AB
Hgb urine dipstick: NEGATIVE
KETONES UR: NEGATIVE mg/dL
Leukocytes, UA: NEGATIVE
Nitrite: NEGATIVE
PROTEIN: 100 mg/dL — AB
Specific Gravity, Urine: 1.017 (ref 1.005–1.030)
Squamous Epithelial / LPF: NONE SEEN (ref 0–5)
pH: 6 (ref 5.0–8.0)

## 2017-08-13 LAB — CBC
HEMATOCRIT: 38.8 % (ref 35.0–47.0)
HEMOGLOBIN: 12.9 g/dL (ref 12.0–16.0)
MCH: 28.9 pg (ref 26.0–34.0)
MCHC: 33.3 g/dL (ref 32.0–36.0)
MCV: 86.8 fL (ref 80.0–100.0)
Platelets: 165 10*3/uL (ref 150–440)
RBC: 4.47 MIL/uL (ref 3.80–5.20)
RDW: 23.3 % — AB (ref 11.5–14.5)
WBC: 6.8 10*3/uL (ref 3.6–11.0)

## 2017-08-13 LAB — HEMOGLOBIN A1C
HEMOGLOBIN A1C: 6.6 % — AB (ref 4.8–5.6)
MEAN PLASMA GLUCOSE: 142.72 mg/dL

## 2017-08-13 LAB — BASIC METABOLIC PANEL
ANION GAP: 9 (ref 5–15)
BUN: 19 mg/dL (ref 6–20)
CALCIUM: 8.9 mg/dL (ref 8.9–10.3)
CO2: 23 mmol/L (ref 22–32)
CREATININE: 1.08 mg/dL — AB (ref 0.44–1.00)
Chloride: 104 mmol/L (ref 101–111)
GFR calc Af Amer: 57 mL/min — ABNORMAL LOW (ref >60)
GFR calc non Af Amer: 49 mL/min — ABNORMAL LOW (ref >60)
Glucose, Bld: 99 mg/dL (ref 65–99)
Potassium: 4.3 mmol/L (ref 3.5–5.1)
Sodium: 136 mmol/L (ref 135–145)

## 2017-08-13 MED ORDER — GLUCERNA SHAKE PO LIQD
237.0000 mL | Freq: Three times a day (TID) | ORAL | Status: DC
Start: 2017-08-13 — End: 2017-08-14
  Administered 2017-08-14: 237 mL via ORAL

## 2017-08-13 MED ORDER — ASPIRIN EC 81 MG PO TBEC
81.0000 mg | DELAYED_RELEASE_TABLET | Freq: Every day | ORAL | Status: DC
  Administered 2017-08-13 – 2017-08-14 (×2): 81 mg via ORAL
  Filled 2017-08-13 (×2): qty 1

## 2017-08-13 MED ORDER — ENALAPRIL MALEATE 5 MG PO TABS
5.0000 mg | ORAL_TABLET | Freq: Every day | ORAL | Status: DC
  Administered 2017-08-13: 5 mg via ORAL
  Filled 2017-08-13 (×2): qty 1

## 2017-08-13 MED ORDER — CALCIUM CARBONATE-VITAMIN D 500-200 MG-UNIT PO TABS
1.0000 | ORAL_TABLET | Freq: Every day | ORAL | Status: DC
  Administered 2017-08-13 – 2017-08-14 (×2): 1 via ORAL
  Filled 2017-08-13 (×2): qty 1

## 2017-08-13 MED ORDER — LORAZEPAM 0.5 MG PO TABS
0.5000 mg | ORAL_TABLET | Freq: Every day | ORAL | Status: DC | PRN
Start: 2017-08-13 — End: 2017-08-14
  Administered 2017-08-13: 0.5 mg via ORAL
  Filled 2017-08-13: qty 1

## 2017-08-13 MED ORDER — ACETAMINOPHEN 325 MG PO TABS
650.0000 mg | ORAL_TABLET | Freq: Four times a day (QID) | ORAL | Status: DC | PRN
  Administered 2017-08-13 (×2): 650 mg via ORAL
  Filled 2017-08-13 (×2): qty 2

## 2017-08-13 MED ORDER — PROPRANOLOL HCL 20 MG PO TABS
20.0000 mg | ORAL_TABLET | Freq: Two times a day (BID) | ORAL | Status: DC
  Administered 2017-08-13 – 2017-08-14 (×3): 20 mg via ORAL
  Filled 2017-08-13 (×5): qty 1

## 2017-08-13 MED ORDER — ALPRAZOLAM 0.25 MG PO TABS
0.2500 mg | ORAL_TABLET | Freq: Three times a day (TID) | ORAL | Status: DC | PRN
  Administered 2017-08-13: 0.25 mg via ORAL
  Filled 2017-08-13: qty 1

## 2017-08-13 MED ORDER — ENOXAPARIN SODIUM 40 MG/0.4ML ~~LOC~~ SOLN
40.0000 mg | SUBCUTANEOUS | Status: DC
  Administered 2017-08-13: 40 mg via SUBCUTANEOUS
  Filled 2017-08-13: qty 0.4

## 2017-08-13 MED ORDER — INSULIN ASPART 100 UNIT/ML ~~LOC~~ SOLN
0.0000 [IU] | Freq: Every day | SUBCUTANEOUS | Status: DC
  Administered 2017-08-13: 2 [IU] via SUBCUTANEOUS
  Filled 2017-08-13: qty 1

## 2017-08-13 MED ORDER — LABETALOL HCL 5 MG/ML IV SOLN
10.0000 mg | INTRAVENOUS | Status: DC | PRN
  Administered 2017-08-13: 10 mg via INTRAVENOUS
  Filled 2017-08-13: qty 4

## 2017-08-13 MED ORDER — IBUPROFEN 400 MG PO TABS: 400.0000 mg | ORAL_TABLET | Freq: Four times a day (QID) | ORAL | Status: DC | PRN

## 2017-08-13 MED ORDER — PANTOPRAZOLE SODIUM 40 MG PO PACK
20.0000 mg | PACK | Freq: Every day | ORAL | Status: DC
  Administered 2017-08-13: 09:00:00 20 mg via ORAL
  Filled 2017-08-13: qty 20

## 2017-08-13 MED ORDER — HYDRALAZINE HCL 20 MG/ML IJ SOLN
10.0000 mg | Freq: Four times a day (QID) | INTRAMUSCULAR | Status: DC | PRN
  Administered 2017-08-13 (×2): 10 mg via INTRAVENOUS
  Filled 2017-08-13 (×2): qty 1

## 2017-08-13 MED ORDER — ATORVASTATIN CALCIUM 20 MG PO TABS
40.0000 mg | ORAL_TABLET | Freq: Every day | ORAL | Status: DC
  Administered 2017-08-13 (×2): 40 mg via ORAL
  Filled 2017-08-13 (×2): qty 2

## 2017-08-13 MED ORDER — LEVOTHYROXINE SODIUM 88 MCG PO TABS
88.0000 ug | ORAL_TABLET | Freq: Every day | ORAL | Status: DC
  Administered 2017-08-13 – 2017-08-14 (×2): 88 ug via ORAL
  Filled 2017-08-13 (×2): qty 1

## 2017-08-13 MED ORDER — DEXTROSE-NACL 5-0.45 % IV SOLN: INTRAVENOUS | Status: DC

## 2017-08-13 MED ORDER — ONDANSETRON HCL 4 MG/2ML IJ SOLN: 4.0000 mg | Freq: Four times a day (QID) | INTRAMUSCULAR | Status: DC | PRN

## 2017-08-13 MED ORDER — INSULIN ASPART 100 UNIT/ML ~~LOC~~ SOLN
0.0000 [IU] | Freq: Three times a day (TID) | SUBCUTANEOUS | Status: DC
  Administered 2017-08-14: 2 [IU] via SUBCUTANEOUS
  Administered 2017-08-14: 3 [IU] via SUBCUTANEOUS
  Filled 2017-08-13 (×2): qty 1

## 2017-08-13 MED ORDER — ACETAMINOPHEN 650 MG RE SUPP: 650.0000 mg | Freq: Four times a day (QID) | RECTAL | Status: DC | PRN

## 2017-08-13 MED ORDER — BUTALBITAL-APAP-CAFFEINE 50-325-40 MG PO TABS
2.0000 | ORAL_TABLET | Freq: Four times a day (QID) | ORAL | Status: DC | PRN
  Administered 2017-08-13: 2 via ORAL
  Filled 2017-08-13 (×2): qty 2

## 2017-08-13 MED ORDER — ADULT MULTIVITAMIN W/MINERALS CH
1.0000 | ORAL_TABLET | Freq: Every day | ORAL | Status: DC
  Administered 2017-08-14: 1 via ORAL
  Filled 2017-08-13: qty 1

## 2017-08-13 MED ORDER — ONDANSETRON HCL 4 MG PO TABS
4.0000 mg | ORAL_TABLET | Freq: Four times a day (QID) | ORAL | Status: DC | PRN
  Administered 2017-08-13: 4 mg via ORAL
  Filled 2017-08-13: qty 1

## 2017-08-13 MED ORDER — GABAPENTIN 600 MG PO TABS
600.0000 mg | ORAL_TABLET | Freq: Two times a day (BID) | ORAL | Status: DC
  Administered 2017-08-13 – 2017-08-14 (×3): 600 mg via ORAL
  Filled 2017-08-13 (×4): qty 1

## 2017-08-13 MED ORDER — VENLAFAXINE HCL ER 75 MG PO CP24
75.0000 mg | ORAL_CAPSULE | Freq: Two times a day (BID) | ORAL | Status: DC
  Administered 2017-08-13 – 2017-08-14 (×3): 75 mg via ORAL
  Filled 2017-08-13 (×3): qty 1

## 2017-08-13 MED ORDER — KETOROLAC TROMETHAMINE 30 MG/ML IJ SOLN
30.0000 mg | Freq: Four times a day (QID) | INTRAMUSCULAR | Status: DC | PRN
  Administered 2017-08-13 (×2): 30 mg via INTRAVENOUS
  Filled 2017-08-13 (×2): qty 1

## 2017-08-13 NOTE — Plan of Care (Signed)
  Problem: Education: Goal: Knowledge of General Education information will improve Outcome: Progressing   Problem: Clinical Measurements: Goal: Ability to maintain clinical measurements within normal limits will improve Outcome: Progressing Goal: Diagnostic test results will improve Outcome: Progressing   Problem: Activity: Goal: Risk for activity intolerance will decrease Outcome: Progressing   Problem: Nutrition: Goal: Adequate nutrition will be maintained Outcome: Progressing   Problem: Pain Managment: Goal: General experience of comfort will improve Outcome: Progressing   

## 2017-08-13 NOTE — Progress Notes (Signed)
Inpatient Diabetes Program Recommendations  AACE/ADA: New Consensus Statement on Inpatient Glycemic Control (2015)  Target Ranges:  Prepandial:   less than 140 mg/dL      Peak postprandial:   less than 180 mg/dL (1-2 hours)      Critically ill patients:  140 - 180 mg/dL   Lab Results  Component Value Date   GLUCAP 167 (H) 08/13/2017   HGBA1C 6.6 (H) 08/13/2017    Review of Glycemic ControlResults for CRISTIE, MCKINNEY (MRN [id number]) as of 08/13/2017 13:09  Ref. Range 08/12/2017 21:41 08/12/2017 22:10 08/12/2017 22:46 08/13/2017 00:55 08/13/2017 02:04 08/13/2017 04:30 08/13/2017 07:38 08/13/2017 11:48  Glucose-Capillary Latest Ref Range: 65 - 99 mg/dL 42 (LL) 99 409 (H) 811 (H) 275 (H) 147 (H) 112 (H) 167 (H)    Diabetes history: Type 2 DM  Outpatient Diabetes medications: Novolog 70/30 insuiln 36 units AM and 32 units PM Current orders for Inpatient glycemic control:  All insulins held due to admission hypoglycemia  Inpatient Diabetes Program Recommendations:    Reviewed chart.  Patient admitted with hypoglycemia.  She evidently was about eat her meal when her blood sugar dropped. Spoke with patient by phone. Patient states that she takes her 70/30 in the morning when she gets up and at bedtime.  I explained that she would likely need adjustments in her insulin prior to discharge to prevent low blood sugars.  Patient agreeable to this.  A1C indicates that blood sugar average is less than 150 mg/dL.  She states that she always takes her insulin no matter what her blood sugar is.    When 70/30 restarted, consider ordering only 1/2 of her dose.  She may be a candidate for CGM due to her admit for hypoglycemia admit as well. Will discuss with patient and family on 5/22.    Thanks,  Beryl Meager, RN, BC-ADM Inpatient Diabetes Coordinator Pager [phone] (8a-5p)

## 2017-08-13 NOTE — Progress Notes (Signed)
[facility] [place] [facility]   PATIENT NAME: Kaitlyn Lewis    MR#:  [id number]  DATE OF BIRTH:  06/01/1940  SUBJECTIVE:   Patient admitted to the hospital secondary to hypoglycemia.  Blood sugars have improved.  Patient was also hypothermic with no evidence of infection noted.  REVIEW OF SYSTEMS:    Review of Systems  Constitutional: Negative for chills and fever.  HENT: Negative for congestion and tinnitus.   Eyes: Negative for blurred vision and double vision.  Respiratory: Negative for cough, shortness of breath and wheezing.   Cardiovascular: Negative for chest pain, orthopnea and PND.  Gastrointestinal: Negative for abdominal pain, diarrhea, nausea and vomiting.  Genitourinary: Negative for dysuria and hematuria.  Neurological: Negative for dizziness, sensory change and focal weakness.  All other systems reviewed and are negative.   Nutrition: Carb control Tolerating Diet: Yes Tolerating PT: Ambulatory   DRUG ALLERGIES:  No Known Allergies  VITALS:  Blood pressure 135/61, pulse 73, temperature 98.4 F (36.9 C), temperature source Oral, resp. rate 17, height  (1.651 m), weight 77.6 kg (171 lb 1.2 oz), SpO2 99 %.  PHYSICAL EXAMINATION:   Physical Exam  GENERAL:  77 y.o.-year-old patient lying in bed in no acute distress.  EYES: Pupils equal, round, reactive to light and accommodation. No scleral icterus. Extraocular muscles intact.  HEENT: Head atraumatic, normocephalic. Oropharynx and nasopharynx clear.  NECK:  Supple, no jugular venous distention. No thyroid enlargement, no tenderness.  LUNGS: Normal breath sounds bilaterally, no wheezing, rales, rhonchi. No use of accessory muscles of respiration.  CARDIOVASCULAR: S1, S2 normal. No murmurs, rubs, or gallops.  ABDOMEN: Soft, nontender, nondistended. Bowel sounds present. No organomegaly or mass.  EXTREMITIES: No cyanosis, clubbing or edema b/l.    NEUROLOGIC: Cranial nerves II through  XII are intact. No focal Motor or sensory deficits b/l.   PSYCHIATRIC: The patient is alert and oriented x 3.  SKIN: No obvious rash, lesion, or ulcer.    LABORATORY PANEL:   CBC Recent Labs  Lab 08/13/17 0714  WBC 6.8  HGB 12.9  HCT 38.8  PLT 165   ------------------------------------------------------------------------------------------------------------------  Chemistries  Recent Labs  Lab 08/12/17 2231 08/13/17 0625  NA 137 136  K 4.1 4.3  CL 100* 104  CO2 26 23  GLUCOSE 98 99  BUN 22* 19  CREATININE 1.16* 1.08*  CALCIUM 9.6 8.9  AST 34  --   ALT 26  --   ALKPHOS 58  --   BILITOT 1.0  --    ------------------------------------------------------------------------------------------------------------------  Cardiac Enzymes Recent Labs  Lab 08/12/17 2231  TROPONINI <0.03   ------------------------------------------------------------------------------------------------------------------  RADIOLOGY:  Dg Chest Port 1 View  Result Date: 08/12/2017 CLINICAL DATA:  Altered mental status, hypoglycemic. EXAM: PORTABLE CHEST 1 VIEW COMPARISON:  Chest radiograph July 03, 2014 FINDINGS: Cardiomediastinal silhouette is normal. No pleural effusions or focal consolidations. RIGHT lung base strandy densities. Trachea projects midline and there is no pneumothorax. Soft tissue planes and included osseous structures are non-suspicious. Surgical clips in the included right abdomen compatible with cholecystectomy. IMPRESSION: RIGHT lung base atelectasis/scarring. Electronically Signed   By: Awilda Metro M.D.   On: 08/12/2017 22:32     ASSESSMENT AND PLAN:   77 year old female with past medical history of diabetes, hypertension, hyperlipidemia, GERD, hypothyroidism who presented to the hospital due to hypoglycemia-hypothermia.  1.  Severe hypoglycemia- etiology unclear but suspected to be secondary to patient's poor p.o. intake and concurrent use of insulin.  Seen by  diabetes  coordinator today and the recommending adjusting her insulin prior to discharge. - Hypoglycemia much improved after being on a dextrose drip.  Continue carb controlled diet for now.  2.  Hypothermia- likely secondary to the severe hypoglycemia.  No evidence of sepsis or infection. - Blood cultures have remained negative, patient had a normal white cell count.  Clinically patient has no evidence of sepsis.  Chest x-ray and urinalysis were negative.  3.  Essential hypertension-continue enalapril.  Next  4.  Hypothyroidism-continue Synthroid.  5.  Hyperlipidemia-continue atorvastatin.  6.  GERD-continue Protonix.  7.  Osteoporosis-continue calcium vitamin D supplements.  Discharge home tomorrow after adjustment of her insulin.  All the records are reviewed and case discussed with Care Management/Social Worker. Management plans discussed with the patient, family and they are in agreement.  CODE STATUS: Full code  DVT Prophylaxis: Lovenox  TOTAL TIME TAKING CARE OF THIS PATIENT: 30 minutes.   POSSIBLE D/C IN 1-2 DAYS, DEPENDING ON CLINICAL CONDITION.   Houston Siren M.D on 08/13/2017 at 2:27 PM  Between 7am to 6pm - Pager - [phone]  After 6pm go to [email or web address] - Social research officer, government  [facility] [place] [facility]  Office  [phone]  CC: Primary care physician; Baltazar Najjar, MD

## 2017-08-13 NOTE — H&P (Signed)
[facility] - [place] at [facility]   PATIENT NAME: Kaitlyn Lewis    MR#:  [id number]  DATE OF BIRTH:  1941-01-03  DATE OF ADMISSION:  08/12/2017  PRIMARY CARE PHYSICIAN: Baltazar Najjar, MD   REQUESTING/REFERRING PHYSICIAN: Don Perking, MD  CHIEF COMPLAINT:   Chief Complaint  Patient presents with  . Hypoglycemia  . Hypertension    HISTORY OF PRESENT ILLNESS:  Kaitlyn Lewis  is a 77 y.o. female who presents with alteration in mental status and profound hypoglycemia.  Patient was eating dinner with her granddaughter when she became altered.  She was brought to the ED and found to be significantly hypoglycemic, with a glucose level in the 20s.  She was also hypothermic with temperature around 92 F.  Initial concern by ED physician was for sepsis.  However, once the patient's glucose came up with treatment she became alert and seemed to return to her baseline mental function.  Further work-up was not consistent with sepsis.  Hospitalist were called for admission  PAST MEDICAL HISTORY:   Past Medical History:  Diagnosis Date  . Diabetes mellitus without complication (HCC)   . GERD (gastroesophageal reflux disease)   . Hyperlipemia   . Hypertension   . Thyroid disease      PAST SURGICAL HISTORY:   Past Surgical History:  Procedure Laterality Date  . ABDOMINAL HYSTERECTOMY    . BRAIN SURGERY     benign tumor removal  . CHOLECYSTECTOMY    . COLONOSCOPY N/A 07/14/2017   Procedure: COLONOSCOPY;  Surgeon: Toney Reil, MD;  Location: [facility] ENDOSCOPY;  Service: Gastroenterology;  Laterality: N/A;  . ESOPHAGOGASTRODUODENOSCOPY N/A 07/14/2017   Procedure: ESOPHAGOGASTRODUODENOSCOPY (EGD);  Surgeon: Toney Reil, MD;  Location: [facility] ENDOSCOPY;  Service: Gastroenterology;  Laterality: N/A;  . THYROIDECTOMY       SOCIAL HISTORY:   Social History   Tobacco Use  . Smoking status: Never Smoker  . Smokeless tobacco: Never Used  Substance Use Topics   . Alcohol use: Never    Frequency: Never     FAMILY HISTORY:   Family History  Problem Relation Age of Onset  . CAD Brother      DRUG ALLERGIES:  No Known Allergies  MEDICATIONS AT HOME:   Prior to Admission medications   Medication Sig Start Date End Date Taking? Authorizing Provider  amitriptyline (ELAVIL) 25 MG tablet Take 50 mg by mouth at bedtime. 05/30/17  Yes [provider]  aspirin EC 81 MG tablet Take 81 mg by mouth daily.   Yes [provider]  atorvastatin (LIPITOR) 40 MG tablet Take 40 mg by mouth at bedtime. 04/30/17  Yes [provider]  Calcium Carbonate-Vitamin D 600-400 MG-UNIT tablet Take 1 tablet by mouth daily. 07/27/08  Yes [provider]  enalapril (VASOTEC) 5 MG tablet Take 5 mg by mouth at bedtime. 07/03/17  Yes [provider]  ferrous sulfate 325 (65 FE) MG tablet Take 1 tablet (325 mg total) by mouth daily with breakfast. 07/15/17  Yes Sainani, Rolly Pancake, MD  gabapentin (NEURONTIN) 600 MG tablet Take 600 mg by mouth 2 (two) times daily. 06/06/17  Yes [provider]  HUMULIN 70/30 KWIKPEN (70-30) 100 UNIT/ML PEN Inject 32-36 Units into the skin 2 (two) times daily. Inject 36 units in the morning and 32 units at night. 06/25/17  Yes [provider]  levothyroxine (SYNTHROID, LEVOTHROID) 88 MCG tablet Take 88 mcg by mouth daily. 05/20/17  Yes [provider]  LORazepam (  ATIVAN) 0.5 MG tablet Take 0.5 mg by mouth daily as needed for anxiety or sleep. 04/25/17  Yes [provider]  magnesium oxide (MAG-OX) 400 MG tablet Take 400 mg by mouth 2 (two) times daily. 07/28/14  Yes [provider]  metFORMIN (GLUCOPHAGE-XR) 500 MG 24 hr tablet Take 500 mg by mouth 2 (two) times daily. 05/10/17  Yes [provider]  omeprazole (PRILOSEC) 20 MG capsule Take 20 mg by mouth daily. 04/24/17  Yes [provider]  potassium chloride (K-DUR) 10 MEQ tablet Take 10 mEq by mouth daily.    Yes [provider]  PROAIR HFA 108 (90 Base) MCG/ACT inhaler Inhale 2 puffs into the lungs every 4 (four) hours as needed for shortness of breath. 04/24/17  Yes [provider]  propranolol (INDERAL) 20 MG tablet Take 20 mg by mouth 2 (two) times daily. 06/06/17  Yes [provider]  tiZANidine (ZANAFLEX) 4 MG tablet Take 2 mg by mouth 2 (two) times daily.   Yes [provider]  venlafaxine XR (EFFEXOR-XR) 75 MG 24 hr capsule Take 75 mg by mouth 2 (two) times daily.    Yes [provider]  vitamin B-12 (CYANOCOBALAMIN) 1000 MCG tablet Take 1,000 mcg by mouth daily.   Yes [provider]    REVIEW OF SYSTEMS:  Review of Systems  Constitutional: Negative for chills, fever, malaise/fatigue and weight loss.  HENT: Negative for ear pain, hearing loss and tinnitus.   Eyes: Negative for blurred vision, double vision, pain and redness.  Respiratory: Negative for cough, hemoptysis and shortness of breath.   Cardiovascular: Negative for chest pain, palpitations, orthopnea and leg swelling.  Gastrointestinal: Negative for abdominal pain, constipation, diarrhea, nausea and vomiting.  Genitourinary: Negative for dysuria, frequency and hematuria.  Musculoskeletal: Negative for back pain, joint pain and neck pain.  Skin:       No acne, rash, or lesions  Neurological: Positive for loss of consciousness. Negative for dizziness, tremors, focal weakness and weakness.  Endo/Heme/Allergies: Negative for polydipsia. Does not bruise/bleed easily.  Psychiatric/Behavioral: Negative for depression. The patient is not nervous/anxious and does not have insomnia.      VITAL SIGNS:   Vitals:   08/13/17 0000 08/13/17 0015 08/13/17 0030 08/13/17 0046  BP: (!) 197/92 (!) 222/162 (!) 152/91   Pulse: 94 96 92   Resp: 14  14   Temp: (!) 96.9 F (36.1 C) (!) 97.3 F (36.3 C) 97.6 F (36.4 C)   TempSrc:      SpO2: 98% 100% 96%   Weight:    76.7 kg (169 lb)   Height:     (1.651 m)   Wt Readings from Last 3 Encounters:  08/13/17 76.7 kg (169 lb)  07/11/17 78.9 kg (174 lb)  08/15/16 74.8 kg (165 lb)    PHYSICAL EXAMINATION:  Physical Exam  Vitals reviewed. Constitutional: She is oriented to person, place, and time. She appears well-developed and well-nourished. No distress.  HENT:  Head: Normocephalic and atraumatic.  Mouth/Throat: Oropharynx is clear and moist.  Eyes: Pupils are equal, round, and reactive to light. Conjunctivae and EOM are normal. No scleral icterus.  Neck: Normal range of motion. Neck supple. No JVD present. No thyromegaly present.  Cardiovascular: Normal rate, regular rhythm and intact distal pulses. Exam reveals no gallop and no friction rub.  No murmur heard. Respiratory: Effort normal and breath sounds normal. No respiratory distress. She has no wheezes. She has no rales.  GI: Soft. Bowel sounds  are normal. She exhibits no distension. There is no tenderness.  Musculoskeletal: Normal range of motion. She exhibits no edema.  No arthritis, no gout  Lymphadenopathy:    She has no cervical adenopathy.  Neurological: She is alert and oriented to person, place, and time. No cranial nerve deficit.  No dysarthria, no aphasia  Skin: Skin is warm and dry. No rash noted. No erythema.  Psychiatric: She has a normal mood and affect. Her behavior is normal. Judgment and thought content normal.    LABORATORY PANEL:   CBC Recent Labs  Lab 08/12/17 2231  WBC 10.0  HGB 14.2  HCT 43.8  PLT 205   ------------------------------------------------------------------------------------------------------------------  Chemistries  Recent Labs  Lab 08/12/17 2231  NA 137  K 4.1  CL 100*  CO2 26  GLUCOSE 98  BUN 22*  CREATININE 1.16*  CALCIUM 9.6  AST 34  ALT 26  ALKPHOS 58  BILITOT 1.0   ------------------------------------------------------------------------------------------------------------------  Cardiac  Enzymes Recent Labs  Lab 08/12/17 2231  TROPONINI <0.03   ------------------------------------------------------------------------------------------------------------------  RADIOLOGY:  Dg Chest Port 1 View  Result Date: 08/12/2017 CLINICAL DATA:  Altered mental status, hypoglycemic. EXAM: PORTABLE CHEST 1 VIEW COMPARISON:  Chest radiograph July 03, 2014 FINDINGS: Cardiomediastinal silhouette is normal. No pleural effusions or focal consolidations. RIGHT lung base strandy densities. Trachea projects midline and there is no pneumothorax. Soft tissue planes and included osseous structures are non-suspicious. Surgical clips in the included right abdomen compatible with cholecystectomy. IMPRESSION: RIGHT lung base atelectasis/scarring. Electronically Signed   By: Awilda Metro M.D.   On: 08/12/2017 22:32    EKG:   Orders placed or performed during the hospital encounter of 08/12/17  . EKG 12-Lead  . EKG 12-Lead  . ED EKG  . ED EKG    IMPRESSION AND PLAN:  Principal Problem:   Severe diabetic hypoglycemia (HCC) -suspect profound hypo-thermia was due to her severe hypoglycemia.  Unclear etiology, though speaking with patient it seems that she perhaps has a significantly fluctuating diet and may be did not eat as much as she needed to today.  Will observe her tonight, with repeat labs in the morning has a final evaluation for possibility of infection.  We will also check a hemoglobin A1c.  She was on D5 for hypoglycemia and her glucose is now up in the 200s.  Frequent fingerstick glucose checks tonight. Active Problems:   HTN (hypertension) -continue home meds   Diabetes (HCC) -work-up and treatment as above   Hypothyroidism -home dose thyroid replacement   HLD (hyperlipidemia) -Home dose antilipid   GERD (gastroesophageal reflux disease) -Home dose PPI  Chart review performed and case discussed with ED provider. Labs, imaging and/or ECG reviewed by provider and discussed with  patient/family. Management plans discussed with the patient and/or family.  DVT PROPHYLAXIS: SubQ lovenox  GI PROPHYLAXIS: PPI  ADMISSION STATUS: Observation  CODE STATUS: Full Code Status History    Date Active Date Inactive Code Status Order ID Comments User Context   07/11/2017 1439 07/14/2017 2206 Full Code [id number]  Enid Baas, MD Inpatient      TOTAL TIME TAKING CARE OF THIS PATIENT: 40 minutes.   Lavontae Cornia FIELDING 08/13/2017, 12:54 AM  [facility]  Office  [phone]  CC: Primary care physician; Baltazar Najjar, MD  Note:  This document was prepared using Dragon voice recognition software and may include unintentional dictation errors.

## 2017-08-13 NOTE — Care Management Obs Status (Signed)
MEDICARE OBSERVATION STATUS NOTIFICATION   Patient Details  Name: Kaitlyn Lewis MRN: [id number] Date of Birth: 10-May-1940   Medicare Observation Status Notification Given:  Yes    Gwenette Greet, RN 08/13/2017, 8:54 AM

## 2017-08-13 NOTE — Care Management Note (Signed)
Case Management Note  Patient Details  Name: Kaitlyn Lewis MRN: [id number] Date of Birth: 1940-04-03  Subjective/Objective:   Admitted to [facility] with the diagnosis of sever hypoglycemia under observation status. Lives with daughter, Leida Lauth [phone]). Prescriptions are filled at [facility] on [address]. No home Health. No skilled facility. No home oxygen. No medical equipment in the home. Takes care of all basic and instrumental activities of daily living herself, drives. Fell yesterday. Good appetite. Daughter will transport.                 Action/Plan: No discharge needs identified at this time. Will continue to follow   Expected Discharge Date:                  Expected Discharge Plan:     In-House Referral:     Discharge planning Services     Post Acute Care Choice:    Choice offered to:     DME Arranged:    DME Agency:     HH Arranged:    HH Agency:     Status of Service:     If discussed at [employer] of Stay Meetings, dates discussed:    Additional Comments:  Gwenette Greet, RN MSN CCM Care Management [phone] 08/13/2017, 8:58 AM

## 2017-08-13 NOTE — Progress Notes (Signed)
Initial Nutrition Assessment  DOCUMENTATION CODES:   Not applicable  INTERVENTION:  Provide Glucerna Shake po TID, each supplement provides 220 kcal and 10 grams of protein. Patient prefers vanilla, strawberry, or butter pecan. She does not like chocolate.  Provide daily MVI.  Encouraged regular intake of well-balanced meals throughout the day.  NUTRITION DIAGNOSIS:   Inadequate oral intake related to decreased appetite as evidenced by per patient/family report.  GOAL:   Patient will meet greater than or equal to 90% of their needs  MONITOR:   PO intake, Supplement acceptance, Labs, Weight trends, I & O's  REASON FOR ASSESSMENT:   Malnutrition Screening Tool    ASSESSMENT:   77 year old female with PMHx of DM type 2, HTN, HLD, GERD, hypothyroidism who is now admitted with severe diabetic hypoglycemia and hypothermia.   Met with patient and her daughter at bedside. She reports she has had a decreased appetite for a while now. She lives with her daughter. She likes to stay in her room during the day instead of coming out to prepare a breakfast and lunch. Instead she has small snacks during the day. Her only meal is dinner, which is prepared by her daughter. Patient endorses anorexia. She has occasional abdominal pain and nausea, but reports she feels fine today. She reports some difficulty swallowing, but reports she had a "study" done which did not find anything wrong with her swallowing. The only recent procedure in chart is an EGD from 07/14/2017, but this EGD was completed for iron deficiency anemia secondary to chronic blood loss. There was a small hiatal hernia found on 4/21. Did find an EGD that was completed on 03/15/2016 for dysphagia, but findings were a normal esophagus, food residue in stomach, and normal duodenum.  Patient reports her UBW is 164 lbs and that she thinks has lost about 5-6 lbs recently. She is currently above her UBW.  Meal Completion: 90% of lunch today  (473 kcal, 23 grams of protein)  Medications reviewed and include: Oscal with D 1 tablet daily, glucagon 1 mg IM once today, levothyroxine, Protonix.  Labs reviewed: CBG 29-273 past 24 hrs, Creatinine 1.08, HgbA1c 6.6. On 07/12/2017 Iron 35, TIBC 336, Folate 6.9, Vitamin B12 900.  Patient does not meet criteria for malnutrition at this time.  NUTRITION - FOCUSED PHYSICAL EXAM:    Most Recent Value  Orbital Region  No depletion  Upper Arm Region  No depletion  Thoracic and Lumbar Region  No depletion  Buccal Region  No depletion  Temple Region  No depletion  Clavicle Bone Region  No depletion  Clavicle and Acromion Bone Region  No depletion  Scapular Bone Region  No depletion  Dorsal Hand  No depletion  Patellar Region  No depletion  Anterior Thigh Region  No depletion  Posterior Calf Region  No depletion  Edema (RD Assessment)  Mild  Hair  Reviewed  Eyes  Reviewed  Mouth  Reviewed  Skin  Reviewed  Nails  Reviewed     Diet Order:   Diet Order           Diet heart healthy/carb modified Room service appropriate? Yes; Fluid consistency: Thin  Diet effective now          EDUCATION NEEDS:   Education needs have been addressed  Skin:  Skin Assessment: Reviewed RN Assessment  Last BM:  08/13/2017 - medium type 1  Height:   Ht Readings from Last 1 Encounters:  08/13/17 5' 5" (1.651 m)  Weight:   Wt Readings from Last 1 Encounters:  08/13/17 171 lb 1.2 oz (77.6 kg)    Ideal Body Weight:  56.8 kg  BMI:  Body mass index is 28.47 kg/m.  Estimated Nutritional Needs:   Kcal:  1530-1800 (MSJ x 1.2-1.4)  Protein:  80-90 grams (1-1.2 grams/kg)  Fluid:  1.5-1.8 L/day (1 mL/kcal)  Willey Blade, MS, RD, LDN Office: [phone] Pager: [phone] After Hours/Weekend Pager: [phone]

## 2017-08-13 NOTE — Plan of Care (Signed)
  Problem: Education: Goal: Knowledge of General Education information will improve Outcome: Progressing   Problem: Clinical Measurements: Goal: Ability to maintain clinical measurements within normal limits will improve Outcome: Progressing Goal: Diagnostic test results will improve Outcome: Progressing   Problem: Activity: Goal: Risk for activity intolerance will decrease Outcome: Progressing   Problem: Nutrition: Goal: Adequate nutrition will be maintained Outcome: Progressing   Problem: Pain Managment: Goal: General experience of comfort will improve Outcome: Progressing

## 2017-08-14 ENCOUNTER — Observation Stay: Payer: Medicare Other

## 2017-08-14 ENCOUNTER — Encounter: Payer: Self-pay | Admitting: Radiology

## 2017-08-14 DIAGNOSIS — E11649 Type 2 diabetes mellitus with hypoglycemia without coma: Secondary | ICD-10-CM

## 2017-08-14 LAB — GLUCOSE, CAPILLARY
GLUCOSE-CAPILLARY: 230 mg/dL — AB (ref 65–99)
Glucose-Capillary: 240 mg/dL — ABNORMAL HIGH (ref 65–99)
Glucose-Capillary: 191 mg/dL — ABNORMAL HIGH (ref 65–99)
Glucose-Capillary: 209 mg/dL — ABNORMAL HIGH (ref 65–99)

## 2017-08-14 LAB — MRSA PCR SCREENING: MRSA by PCR: POSITIVE — AB

## 2017-08-14 MED ORDER — HUMULIN 70/30 KWIKPEN (70-30) 100 UNIT/ML ~~LOC~~ SUPN: 10.0000 [IU] | PEN_INJECTOR | Freq: Two times a day (BID) | SUBCUTANEOUS | 11 refills | Status: AC

## 2017-08-14 MED ORDER — CHLORHEXIDINE GLUCONATE CLOTH 2 % EX PADS
6.0000 | MEDICATED_PAD | Freq: Every day | CUTANEOUS | Status: DC
  Administered 2017-08-14: 6 via TOPICAL

## 2017-08-14 MED ORDER — IOPAMIDOL (ISOVUE-370) INJECTION 76%
100.0000 mL | Freq: Once | INTRAVENOUS | Status: AC | PRN
  Administered 2017-08-14: 100 mL via INTRAVENOUS

## 2017-08-14 MED ORDER — NICARDIPINE HCL IN NACL 20-0.86 MG/200ML-% IV SOLN
3.0000 mg/h | INTRAVENOUS | Status: DC
  Administered 2017-08-14: 02:00:00 5 mg/h via INTRAVENOUS
  Filled 2017-08-14: qty 200

## 2017-08-14 MED ORDER — ENOXAPARIN SODIUM 40 MG/0.4ML ~~LOC~~ SOLN: 40.0000 mg | SUBCUTANEOUS | Status: DC

## 2017-08-14 MED ORDER — PANTOPRAZOLE SODIUM 40 MG PO TBEC
40.0000 mg | DELAYED_RELEASE_TABLET | Freq: Every day | ORAL | Status: DC
  Administered 2017-08-14: 40 mg via ORAL
  Filled 2017-08-14: qty 1

## 2017-08-14 MED ORDER — MUPIROCIN 2 % EX OINT
1.0000 "application " | TOPICAL_OINTMENT | Freq: Two times a day (BID) | CUTANEOUS | Status: DC
  Administered 2017-08-14 (×2): 1 via NASAL
  Filled 2017-08-14: qty 22

## 2017-08-14 MED ORDER — OXYCODONE-ACETAMINOPHEN 5-325 MG PO TABS
2.0000 | ORAL_TABLET | Freq: Once | ORAL | Status: AC
  Administered 2017-08-14: 2 via ORAL
  Filled 2017-08-14: qty 2

## 2017-08-14 MED ORDER — OXYCODONE-ACETAMINOPHEN 5-325 MG PO TABS: 1.0000 | ORAL_TABLET | ORAL | 0 refills | Status: AC | PRN

## 2017-08-14 MED ORDER — MUPIROCIN 2 % EX OINT: 1.0000 "application " | TOPICAL_OINTMENT | Freq: Two times a day (BID) | CUTANEOUS | 0 refills | Status: AC

## 2017-08-14 MED ORDER — OXYCODONE-ACETAMINOPHEN 5-325 MG PO TABS
2.0000 | ORAL_TABLET | ORAL | Status: DC | PRN
  Administered 2017-08-14 (×2): 2 via ORAL
  Filled 2017-08-14 (×2): qty 2

## 2017-08-14 NOTE — Care Management (Signed)
URRN updated on patient transition to SDU ICU for HTN.

## 2017-08-14 NOTE — Progress Notes (Signed)
Inpatient Diabetes Program Recommendations  AACE/ADA: New Consensus Statement on Inpatient Glycemic Control (2015)  Target Ranges:  Prepandial:   less than 140 mg/dL      Peak postprandial:   less than 180 mg/dL (1-2 hours)      Critically ill patients:  140 - 180 mg/dL   Lab Results  Component Value Date   GLUCAP 209 (H) 08/14/2017   HGBA1C 6.6 (H) 08/13/2017    Review of Glycemic ControlResults for Kaitlyn Lewis, Kaitlyn Lewis (MRN [id number]) as of 08/14/2017 14:02  Ref. Range 08/13/2017 16:58 08/13/2017 20:28 08/14/2017 02:55 08/14/2017 07:36 08/14/2017 12:06  Glucose-Capillary Latest Ref Range: 65 - 99 mg/dL 865 (H) 784 (H) 696 (H) 191 (H) 209 (H)    Diabetes history: Type 2 DM  Outpatient Diabetes medications: Humulin 70/30 36 units AM and 32 units q HS Metformin 500 mg bid Current orders for Inpatient glycemic control:  Novolog sensitive tid with meals and HS  Inpatient Diabetes Program Recommendations:    Spoke with patient regarding hypoglycemic episode.  She states that she took her insulin on 5/20 in the morning and then went to do errands with her grandaughter (whose husband had passed away this week). She did not eat breakfast or lunch after taking her insulin.  She does check her blood sugars, but did not check it prior to hypoglycemia.  We discussed the importance of eating with insulin due to the 30% fast acting insulin.  Patient has also been taking her PM insulin at bedtime.  We discussed that 70/30 should always be given with a meal including breakfast and supper and if she is unable to eat, she may not need insulin.  We reviewed hypoglycemia symptoms and treatment, patient was able to teach back both.  A1C is 6.6%.  Likely needs a reduction in her outpatient insulin regimen.  Consider 20% reduction in 70/30 doses- 30 units with breakfast and 28 units with supper.   Thanks,   Beryl Meager, RN, BC-ADM Inpatient Diabetes Coordinator Pager [phone] (8a-5p)

## 2017-08-14 NOTE — Progress Notes (Signed)
Pt with very difficult to control BP.  Received multiple doses of antihypertensives, varying classes.  Will start nicardipine gtt and move to stepdown unit.  Kristeen Miss [facility] 08/14/2017, 12:28 AM

## 2017-08-14 NOTE — Discharge Instructions (Signed)
Insuline dose changed, please follow your blood sugar 3 times daily.

## 2017-08-14 NOTE — Consult Note (Signed)
Name: Kaitlyn Lewis MRN: [id number] DOB: Sep 07, 1940    ADMISSION DATE:  08/12/2017 CONSULTATION DATE: 08/14/2017  REFERRING MD : Dr. Anne Hahn   CHIEF COMPLAINT: Hypoglycemia   BRIEF PATIENT DESCRIPTION:  77 yo female admitted 05/20 with hypoglycemia of unclear etiology suspected secondary to poor po intake and concurrent use of insulin transferred to stepdown unit 05/22 with hypertension requiring nicardipine gtt   SIGNIFICANT EVENTS/STUDIES:  05/20 Pt admitted to [facility] unit with hypoglycemia and hypothermia  05/22 Pt transferred to stepdown unit   HISTORY OF PRESENT ILLNESS:   This is a 77 yo female with a PMH of Hypothyroidism, HTM, Hyperlipidemia, GERD, and Diabetes Mellitus.  She presented to [facility] ER via EMS on 05/20 with altered mental status.  Upon EMS arrival pt hypoglycemic she received po glucose due to difficulty established iv access. In the ER she remained hypoglycemic with glucose levels in the 20's, and she was also hypothermic temp 92 degrees F concerning for possible sepsis. She received IM glucagon with improvement of glucose and mentation returned to baseline.  Lab results revealed lactic acid 1.5, wbc 10.0, UA negative, and CXR negative for pneumonia workup not consistent with sepsis.  She was subsequently admitted to the [facility] unit by hospitalist team for further workup and treatment.  On 05/22 pt hypertensive with systolic bp 180's-200's despite varying classes of antihypertensives.  Therefore, nicardipine gtt started and pt transferred to stepdown unit by hospitalist team.    PAST MEDICAL HISTORY :   has a past medical history of Diabetes mellitus without complication (HCC), GERD (gastroesophageal reflux disease), Hyperlipemia, Hypertension, and Thyroid disease.  has a past surgical history that includes Thyroidectomy; Brain surgery; Abdominal hysterectomy; Cholecystectomy; Esophagogastroduodenoscopy (N/A, 07/14/2017); and Colonoscopy (N/A, 07/14/2017). Prior to  Admission medications   Medication Sig Start Date End Date Taking? Authorizing Provider  amitriptyline (ELAVIL) 25 MG tablet Take 50 mg by mouth at bedtime. 05/30/17  Yes [provider]  aspirin EC 81 MG tablet Take 81 mg by mouth daily.   Yes [provider]  atorvastatin (LIPITOR) 40 MG tablet Take 40 mg by mouth at bedtime. 04/30/17  Yes [provider]  Calcium Carbonate-Vitamin D 600-400 MG-UNIT tablet Take 1 tablet by mouth daily. 07/27/08  Yes [provider]  enalapril (VASOTEC) 5 MG tablet Take 5 mg by mouth at bedtime. 07/03/17  Yes [provider]  ferrous sulfate 325 (65 FE) MG tablet Take 1 tablet (325 mg total) by mouth daily with breakfast. 07/15/17  Yes Sainani, Rolly Pancake, MD  gabapentin (NEURONTIN) 600 MG tablet Take 600 mg by mouth 2 (two) times daily. 06/06/17  Yes [provider]  HUMULIN 70/30 KWIKPEN (70-30) 100 UNIT/ML PEN Inject 32-36 Units into the skin 2 (two) times daily. Inject 36 units in the morning and 32 units at night. 06/25/17  Yes [provider]  levothyroxine (SYNTHROID, LEVOTHROID) 88 MCG tablet Take 88 mcg by mouth daily. 05/20/17  Yes [provider]  LORazepam (ATIVAN) 0.5 MG tablet Take 0.5 mg by mouth daily as needed for anxiety or sleep. 04/25/17  Yes [provider]  magnesium oxide (MAG-OX) 400 MG tablet Take 400 mg by mouth 2 (two) times daily. 07/28/14  Yes [provider]  metFORMIN (GLUCOPHAGE-XR) 500 MG 24 hr tablet Take 500 mg by mouth 2 (two) times daily. 05/10/17  Yes [provider]  omeprazole (PRILOSEC) 20 MG capsule Take 20 mg by mouth daily. 04/24/17  Yes [provider]  potassium chloride (K-DUR)  10 MEQ tablet Take 10 mEq by mouth daily.   Yes [provider]  PROAIR HFA 108 (90 Base) MCG/ACT inhaler Inhale 2 puffs into the lungs every 4 (four) hours as needed for shortness of breath. 04/24/17  Yes [provider]  propranolol  (INDERAL) 20 MG tablet Take 20 mg by mouth 2 (two) times daily. 06/06/17  Yes [provider]  tiZANidine (ZANAFLEX) 4 MG tablet Take 2 mg by mouth 2 (two) times daily.   Yes [provider]  venlafaxine XR (EFFEXOR-XR) 75 MG 24 hr capsule Take 75 mg by mouth 2 (two) times daily.    Yes [provider]  vitamin B-12 (CYANOCOBALAMIN) 1000 MCG tablet Take 1,000 mcg by mouth daily.   Yes [provider]   No Known Allergies  FAMILY HISTORY:  family history includes CAD in her brother. SOCIAL HISTORY:  reports that she has never smoked. She has never used smokeless tobacco. She reports that she does not drink alcohol or use drugs.  REVIEW OF SYSTEMS: Positives in BOLD  Constitutional: Negative for fever, chills, weight loss, malaise/fatigue and diaphoresis.  HENT: Negative for hearing loss, ear pain, nosebleeds, congestion, sore throat, neck pain, tinnitus and ear discharge.   Eyes: Negative for blurred vision, double vision, photophobia, pain, discharge and redness.  Respiratory: cough, hemoptysis, sputum production, chronic shortness of breath, wheezing and stridor.   Cardiovascular: Negative for chest pain, palpitations, orthopnea, claudication, leg swelling and PND.  Gastrointestinal: Negative for heartburn, nausea, vomiting, abdominal pain, diarrhea, constipation, blood in stool and melena.  Genitourinary: Negative for dysuria, urgency, frequency, hematuria and flank pain.  Musculoskeletal: Negative for myalgias, back pain, joint pain and falls.  Skin: Negative for itching and rash.  Neurological: dizziness, tingling, tremors, sensory change, speech change, focal weakness, seizures, loss of consciousness, weakness and headaches.  Endo/Heme/Allergies: Negative for environmental allergies and polydipsia. Does not bruise/bleed easily.  SUBJECTIVE:  Pt c/o headache   VITAL SIGNS: Temp:  [97.7 F (36.5 C)-98.9 F (37.2 C)] 98.4 F (36.9 C) (05/21  2228) Pulse Rate:  [73-95] 85 (05/22 0013) Resp:  [17-27] 18 (05/21 2228) BP: (135-221)/(61-147) 210/91 (05/22 0013) SpO2:  [95 %-99 %] 99 % (05/22 0013) Weight:  [76.7 kg (169 lb)-77.6 kg (171 lb 1.2 oz)] 77.6 kg (171 lb 1.2 oz) (05/21 0155)  PHYSICAL EXAMINATION: General: well developed, well nourished female, NAD  Neuro: alert and oriented, follows commands, PERRLA HEENT: supple, no JVD  Cardiovascular: s1s2, rrr, no R/G  Lungs: clear throughout, even, non labored  Abdomen: +BS x4, obese, soft, non tender, non distended  Musculoskeletal: normal bulk and tone, no edema  Skin: large fluid filled blister right upper arm, generalized ecchymosis   Recent Labs  Lab 08/12/17 2231 08/13/17 0625  NA 137 136  K 4.1 4.3  CL 100* 104  CO2 26 23  BUN 22* 19  CREATININE 1.16* 1.08*  GLUCOSE 98 99   Recent Labs  Lab 08/12/17 2231 08/13/17 0714  HGB 14.2 12.9  HCT 43.8 38.8  WBC 10.0 6.8  PLT 205 165   Dg Chest Port 1 View  Result Date: 08/12/2017 CLINICAL DATA:  Altered mental status, hypoglycemic. EXAM: PORTABLE CHEST 1 VIEW COMPARISON:  Chest radiograph July 03, 2014 FINDINGS: Cardiomediastinal silhouette is normal. No pleural effusions or focal consolidations. RIGHT lung base strandy densities. Trachea projects midline and there is no pneumothorax. Soft tissue planes and included osseous structures are non-suspicious. Surgical clips in the included right abdomen compatible with cholecystectomy. IMPRESSION:  RIGHT lung base atelectasis/scarring. Electronically Signed   By: Awilda Metro M.D.   On: 08/12/2017 22:32    ASSESSMENT / PLAN: Severe Hypoglycemia etiology unclear high suspicion secondary to poor po intake and concurrent use of insulin-resolved  Accelerated HTN  Hx: GERD, Hypothyroidism, Diabetes Mellitus, and Hyperlipidemia   P: Supplemental O2 for dyspnea and/or hypoxia  Continuous telemetry monitoring  Nicardipine gtt to maintain sbp 160 or less Continue po  cardiac medications Stat CTA Chest Aorta to r/o aortic dissection  Trend BMP Replace electrolytes as indicated Monitor UOP  VTE px: subq lovenox Trend CBC  Monitor for s/sx of bleeding and transfuse for hgb <7 SSI Diabetes Coordinator consulted appreciate input   Sonda Rumble, AGNP  Pulmonary/Critical Care Pager [phone] (please enter 7 digits) PCCM Consult Pager [phone] (please enter 7 digits)

## 2017-08-14 NOTE — Progress Notes (Signed)
Went over discharge instructions with the patient and daughter including medications and follow-up appointment. Discontinue peripheral IV and telemetry monitor. Helped patient transport.

## 2017-08-14 NOTE — Progress Notes (Signed)
eLink Physician-Brief Progress Note Patient Name: Kaitlyn Lewis DOB: 06-13-1940 MRN: [id number]   Date of Service  08/14/2017  HPI/Events of Note  77 yo female with PMH of DM. Admitted d/t hypoglycemia. Now with HTN. Being moved to a stepdown bed for a Nicardipine IV infusion. PCCM asked to assume care. VSS.  eICU Interventions  No new orders.     Intervention Category Evaluation Type: New Patient Evaluation  Lenell Antu 08/14/2017, 2:55 AM

## 2017-08-14 NOTE — Progress Notes (Signed)
Pt has been complaining of severe headache during the day which continued into the night. Her b/p has been unchanged despite administration of several different b/p meds. I gave her scheduled Propanolol and Vasotec. She got 10 mg of Hydralazine and 10 mg of Labetalol with no change. Pt had Toradol for pain which did not help much. I have called Dr Anne Hahn on numerous occasions to address her medical issues. I explained to her that with her blood pressure being out of control that it would be difficult to get her headache to go away. I assured her that we were doing everything possible to help her. Pt also vomited 550 ml of undigested food- she stated that she did feel somewhat better after she vomited. Her blood sugar was 202 she received 2 units of ssi coverage. 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 She has hearing aids but not with her. When I found out that she was going to be transferred to stepdown, I offered to call her daughter Leida Lauth [facility]) but pt said not to call her this late.

## 2017-08-15 LAB — GLUCOSE, CAPILLARY: Glucose-Capillary: 29 mg/dL — CL (ref 65–99)

## 2017-08-18 LAB — CULTURE, BLOOD (ROUTINE X 2)
CULTURE: NO GROWTH
CULTURE: NO GROWTH
Special Requests: ADEQUATE
Special Requests: ADEQUATE

## 2017-08-22 NOTE — Discharge Summary (Signed)
[facility] [place] [facility]   PATIENT NAME: Kaitlyn Lewis    MR#:  [id number]  DATE OF BIRTH:  November 20, 1940  DATE OF ADMISSION:  08/12/2017 ADMITTING PHYSICIAN: Oralia Manis, MD  DATE OF DISCHARGE: 08/14/2017  6:06 PM  PRIMARY CARE PHYSICIAN: Baltazar Najjar, MD    ADMISSION DIAGNOSIS:  Hypoglycemia [E16.2] Hypothermia, initial encounter [T68.XXXA] Sepsis, due to unspecified organism (HCC) [A41.9]  DISCHARGE DIAGNOSIS:  Principal Problem:   Severe diabetic hypoglycemia (HCC) Active Problems:   Hypothyroidism   HLD (hyperlipidemia)   HTN (hypertension)   GERD (gastroesophageal reflux disease)   Diabetes (HCC)   SECONDARY DIAGNOSIS:   Past Medical History:  Diagnosis Date  . Diabetes mellitus without complication (HCC)   . GERD (gastroesophageal reflux disease)   . Hyperlipemia   . Hypertension   . Thyroid disease     HOSPITAL COURSE:   77 year old female with past medical history of diabetes, hypertension, hyperlipidemia, GERD, hypothyroidism who presented to the hospital due to hypoglycemia-hypothermia.  1.  Severe hypoglycemia- etiology unclear but suspected to be secondary to patient's poor p.o. intake and concurrent use of insulin.  Seen by diabetes coordinator today and the recommending adjusting her insulin prior to discharge. - Hypoglycemia much improved after being on a dextrose drip.  Continue carb controlled diet for now.  2.  Hypothermia- likely secondary to the severe hypoglycemia.  No evidence of sepsis or infection. - Blood cultures have remained negative, patient had a normal white cell count.  Clinically patient has no evidence of sepsis.  Chest x-ray and urinalysis were negative.  3.  Essential hypertension-continue enalapril.   Pt had some hypertensive urgency- so transferred to stepdown, later improved and changed meds.  4.  Hypothyroidism-continue Synthroid.  5.  Hyperlipidemia-continue atorvastatin.  6.   GERD-continue Protonix.  7.  Osteoporosis-continue calcium vitamin D supplements.   DISCHARGE CONDITIONS:   Stable.  CONSULTS OBTAINED:    DRUG ALLERGIES:  No Known Allergies  DISCHARGE MEDICATIONS:   Allergies as of 08/14/2017   No Known Allergies     Medication List    TAKE these medications   amitriptyline 25 MG tablet Commonly known as:  ELAVIL Take 50 mg by mouth at bedtime.   aspirin EC 81 MG tablet Take 81 mg by mouth daily.   atorvastatin 40 MG tablet Commonly known as:  LIPITOR Take 40 mg by mouth at bedtime.   Calcium Carbonate-Vitamin D 600-400 MG-UNIT tablet Take 1 tablet by mouth daily.   enalapril 5 MG tablet Commonly known as:  VASOTEC Take 5 mg by mouth at bedtime.   ferrous sulfate 325 (65 FE) MG tablet Take 1 tablet (325 mg total) by mouth daily with breakfast.   gabapentin 600 MG tablet Commonly known as:  NEURONTIN Take 600 mg by mouth 2 (two) times daily.   HUMULIN 70/30 KWIKPEN (70-30) 100 UNIT/ML PEN Generic drug:  Insulin Isophane & Regular Human Inject 10 Units into the skin 2 (two) times daily. What changed:    how much to take  additional instructions   levothyroxine 88 MCG tablet Commonly known as:  SYNTHROID, LEVOTHROID Take 88 mcg by mouth daily.   LORazepam 0.5 MG tablet Commonly known as:  ATIVAN Take 0.5 mg by mouth daily as needed for anxiety or sleep.   magnesium oxide 400 MG tablet Commonly known as:  MAG-OX Take 400 mg by mouth 2 (two) times daily.   metFORMIN 500 MG 24 hr tablet Commonly known as:  GLUCOPHAGE-XR Take  500 mg by mouth 2 (two) times daily.   omeprazole 20 MG capsule Commonly known as:  PRILOSEC Take 20 mg by mouth daily.   oxyCODONE-acetaminophen 5-325 MG tablet Commonly known as:  PERCOCET/ROXICET Take 1 tablet by mouth every 4 (four) hours as needed for moderate pain or severe pain.   potassium chloride 10 MEQ tablet Commonly known as:  K-DUR Take 10 mEq by mouth daily.    PROAIR HFA 108 (90 Base) MCG/ACT inhaler Generic drug:  albuterol Inhale 2 puffs into the lungs every 4 (four) hours as needed for shortness of breath.   propranolol 20 MG tablet Commonly known as:  INDERAL Take 20 mg by mouth 2 (two) times daily.   tiZANidine 4 MG tablet Commonly known as:  ZANAFLEX Take 2 mg by mouth 2 (two) times daily.   venlafaxine XR 75 MG 24 hr capsule Commonly known as:  EFFEXOR-XR Take 75 mg by mouth 2 (two) times daily.   vitamin B-12 1000 MCG tablet Commonly known as:  CYANOCOBALAMIN Take 1,000 mcg by mouth daily.     ASK your doctor about these medications   mupirocin ointment 2 % Commonly known as:  BACTROBAN Place 1 application into the nose 2 (two) times daily for 5 days. Ask about: Should I take this medication?        DISCHARGE INSTRUCTIONS:    Follow with PMD.  If you experience worsening of your admission symptoms, develop shortness of breath, life threatening emergency, suicidal or homicidal thoughts you must seek medical attention immediately by calling 911 or calling your MD immediately  if symptoms less severe.  You Must read complete instructions/literature along with all the possible adverse reactions/side effects for all the Medicines you take and that have been prescribed to you. Take any new Medicines after you have completely understood and accept all the possible adverse reactions/side effects.   Please note  You were cared for by a hospitalist during your hospital stay. If you have any questions about your discharge medications or the care you received while you were in the hospital after you are discharged, you can call the unit and asked to speak with the hospitalist on call if the hospitalist that took care of you is not available. Once you are discharged, your primary care physician will handle any further medical issues. Please note that NO REFILLS for any discharge medications will be authorized once you are discharged,  as it is imperative that you return to your primary care physician (or establish a relationship with a primary care physician if you do not have one) for your aftercare needs so that they can reassess your need for medications and monitor your lab values.    Today   CHIEF COMPLAINT:   Chief Complaint  Patient presents with  . Hypoglycemia  . Hypertension    HISTORY OF PRESENT ILLNESS:  Sherisse Fullilove  is a 77 y.o. female with a known history of alteration in mental status and profound hypoglycemia.  Patient was eating dinner with her granddaughter when she became altered.  She was brought to the ED and found to be significantly hypoglycemic, with a glucose level in the 20s.  She was also hypothermic with temperature around 92 F.  Initial concern by ED physician was for sepsis.  However, once the patient's glucose came up with treatment she became alert and seemed to return to her baseline mental function.  Further work-up was not consistent with sepsis.  Hospitalist were called for admission  VITAL SIGNS:  Blood pressure (!) 155/93, pulse 89, temperature 98.4 F (36.9 C), temperature source Oral, resp. rate 13, height  (1.651 m), weight 77.6 kg (171 lb 1.2 oz), SpO2 97 %.  I/O:  No intake or output data in the 24 hours ending 08/22/17 2313  PHYSICAL EXAMINATION:  GENERAL:  77 y.o.-year-old patient lying in the bed with no acute distress.  EYES: Pupils equal, round, reactive to light and accommodation. No scleral icterus. Extraocular muscles intact.  HEENT: Head atraumatic, normocephalic. Oropharynx and nasopharynx clear.  NECK:  Supple, no jugular venous distention. No thyroid enlargement, no tenderness.  LUNGS: Normal breath sounds bilaterally, no wheezing, rales,rhonchi or crepitation. No use of accessory muscles of respiration.  CARDIOVASCULAR: S1, S2 normal. No murmurs, rubs, or gallops.  ABDOMEN: Soft, non-tender, non-distended. Bowel sounds present. No organomegaly or  mass.  EXTREMITIES: No pedal edema, cyanosis, or clubbing.  NEUROLOGIC: Cranial nerves II through XII are intact. Muscle strength 5/5 in all extremities. Sensation intact. Gait not checked.  PSYCHIATRIC: The patient is alert and oriented x 3.  SKIN: No obvious rash, lesion, or ulcer.   DATA REVIEW:   CBC No results for input(s): WBC, HGB, HCT, PLT in the last 168 hours.  Chemistries  No results for input(s): NA, K, CL, CO2, GLUCOSE, BUN, CREATININE, CALCIUM, MG, AST, ALT, ALKPHOS, BILITOT in the last 168 hours.  Invalid input(s): GFRCGP  Cardiac Enzymes No results for input(s): TROPONINI in the last 168 hours.  Microbiology Results  Results for orders placed or performed during the hospital encounter of 08/12/17  Blood Culture (routine x 2)     Status: None   Collection Time: 08/12/17 10:31 PM  Result Value Ref Range Status   Specimen Description BLOOD RIGHT HAND  Final   Special Requests   Final    BOTTLES DRAWN AEROBIC AND ANAEROBIC Blood Culture adequate volume   Culture   Final    NO GROWTH 5 DAYS Performed at [facility], [address] [place], [place] [address]    Report Status 08/18/2017 FINAL  Final  Blood Culture (routine x 2)     Status: None   Collection Time: 08/12/17 10:32 PM  Result Value Ref Range Status   Specimen Description BLOOD LEFT HAND   Final   Special Requests   Final    BOTTLES DRAWN AEROBIC AND ANAEROBIC Blood Culture adequate volume   Culture   Final    NO GROWTH 5 DAYS Performed at [facility], [address] [place], [place] [address]    Report Status 08/18/2017 FINAL  Final  MRSA PCR Screening     Status: Abnormal   Collection Time: 08/14/17  3:13 AM  Result Value Ref Range Status   MRSA by PCR POSITIVE (A) NEGATIVE Final    Comment:        The GeneXpert MRSA Assay (FDA approved for NASAL specimens only), is one component of a comprehensive MRSA colonization surveillance program. It is not intended to diagnose  MRSA infection nor to guide or monitor treatment for MRSA infections. RESULT CALLED TO, READ BACK BY AND VERIFIED WITH: Leanord Asal ON 08/14/17 AT 0981 [facility] Performed at [facility], [address] [place], [place] [address]     RADIOLOGY:  No results found.  EKG:   Orders placed or performed during the hospital encounter of 08/12/17  . EKG 12-Lead  . EKG 12-Lead  . ED EKG  . ED EKG      Management  plans discussed with the patient, family and they are in agreement.  CODE STATUS:  Code Status History    Date Active Date Inactive Code Status Order ID Comments User Context   08/13/2017 0156 08/14/2017 2106 Full Code [id number]  Oralia Manis, MD Inpatient   07/11/2017 1439 07/14/2017 2206 Full Code [id number]  Enid Baas, MD Inpatient    Advance Directive Documentation     Most Recent Value  Type of Advance Directive  Healthcare Power of Attorney  Pre-existing out of facility DNR order (yellow form or pink MOST form)  -  "MOST" Form in Place?  -      TOTAL TIME TAKING CARE OF THIS PATIENT: 35 minutes.    Altamese Dilling M.D on 08/22/2017 at 11:13 PM  Between 7am to 6pm - Pager - [phone]  After 6pm go to [email or web address] - password [email or web address]  [facility]  Office  [phone]  CC: Primary care physician; Baltazar Najjar, MD   Note: This dictation was prepared with Dragon dictation along with smaller phrase technology. Any transcriptional errors that result from this process are unintentional.

## 2019-01-23 IMAGING — CT CT ANGIO CHEST
4 of 7 series · 19 of 36 positions shown · IV contrast (APPLIED)
Comparison: Chest radiograph August 20, 2017 and CT abdomen and pelvis
July 11, 2017

CLINICAL DATA: Accelerated hypertension. Altered mental status.
Assess for aortic disease. History of hypertension, hyperlipidemia.

EXAM:
CT ANGIOGRAPHY CHEST WITH CONTRAST
TECHNIQUE: Multidetector CT imaging of the chest was performed using the
standard protocol during bolus administration of intravenous
contrast. Multiplanar CT image reconstructions and MIPs were
obtained to evaluate the vascular anatomy.
CONTRAST:  100mL K03K1D-R4T IOPAMIDOL ([id number]) INJECTION 76%

[Series 4: axial pre · axial · non-contrast · 0.74mm/px · z∈[-670,-525]mm · 3 of 59 slices shown]
[im 15/59  lung]
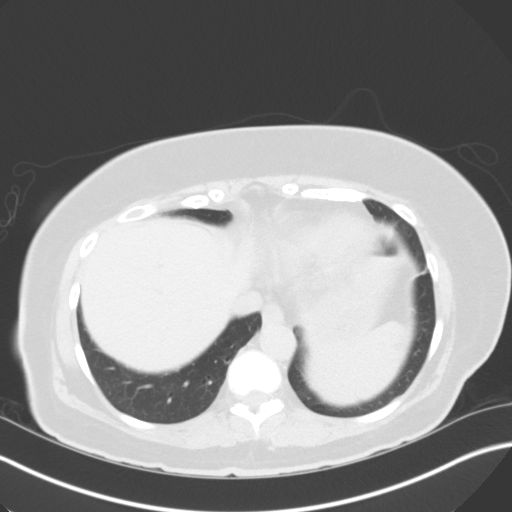
[im 30/59  lung]
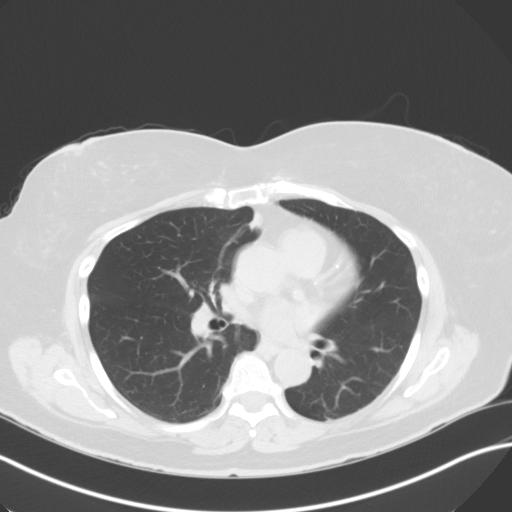
[im 44/59  lung]
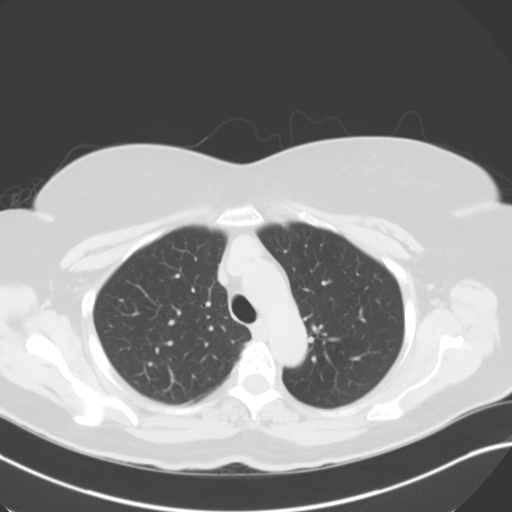

[Series 5: axial arterial · axial · arterial · 0.74mm/px · z∈[-720,-489]mm · 7 of 103 slices shown]
[im 13/103  lung]
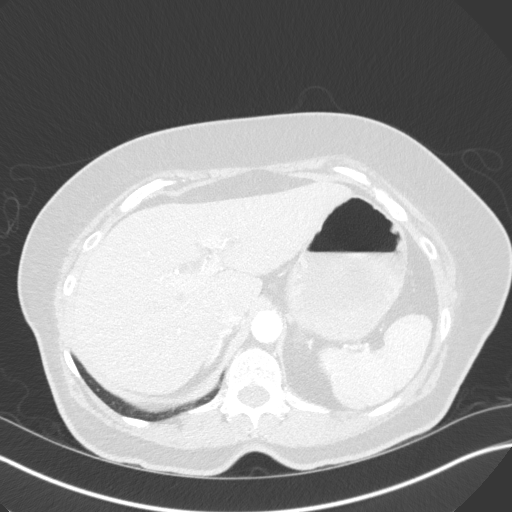
[im 26/103  mediastinal]
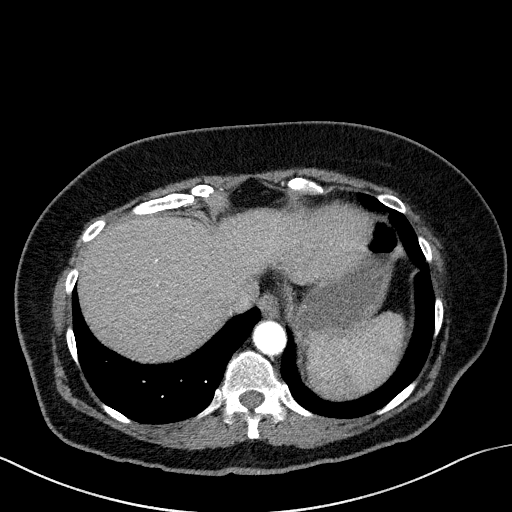
[im 39/103  lung]
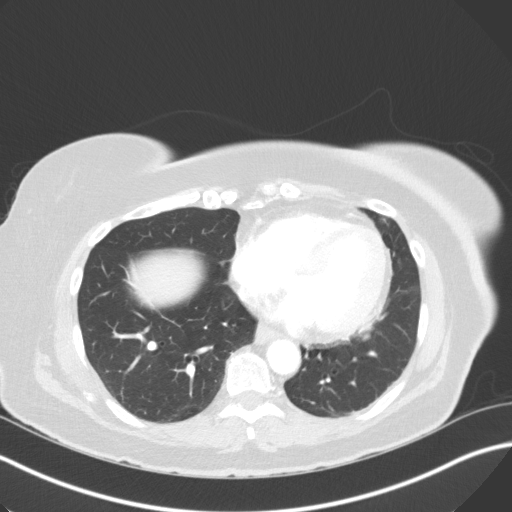
[im 52/103  mediastinal]
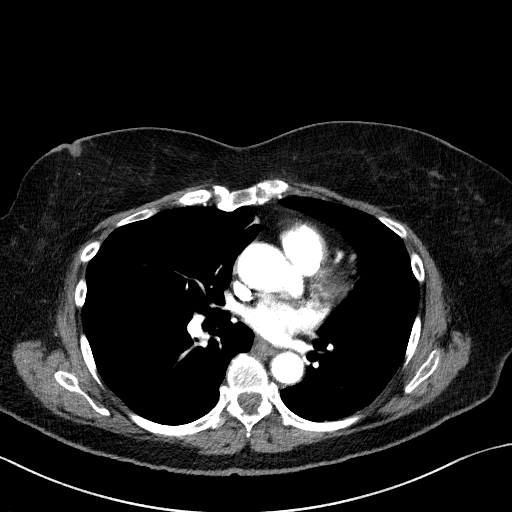
[im 64/103  lung]
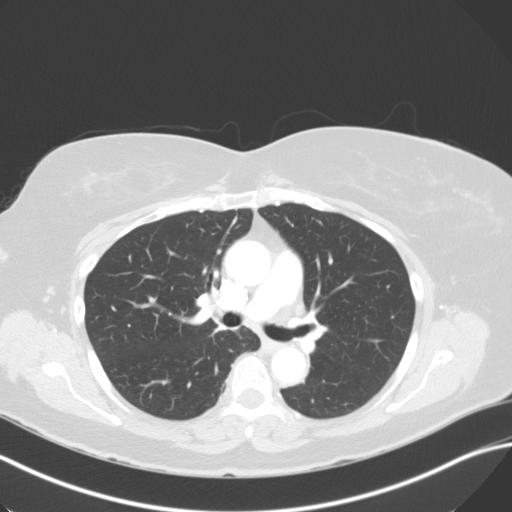
[im 77/103  mediastinal]
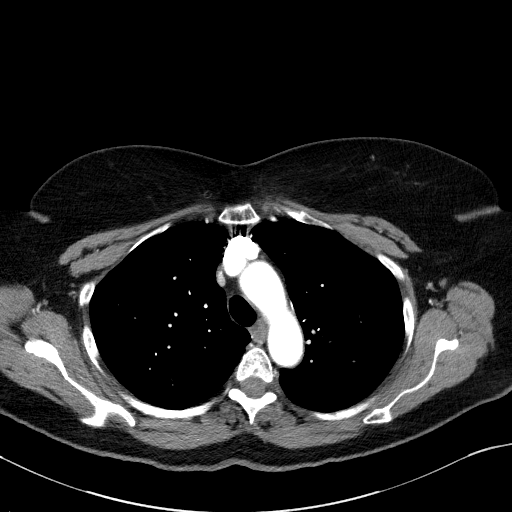
[im 90/103  lung]
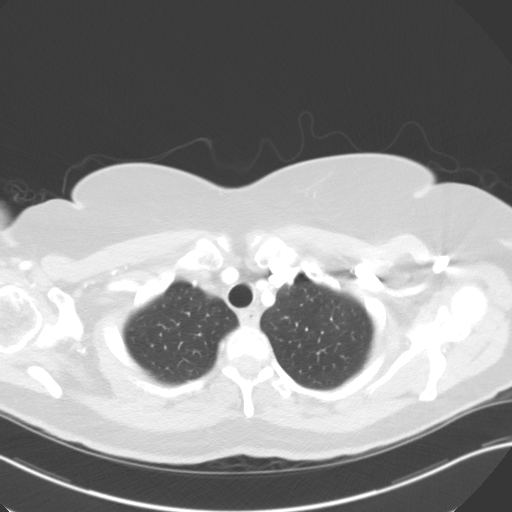

[Series 6: lung · axial · 0.74mm/px · z∈[-734,-474]mm · 8 of 154 slices shown]
[im 12/154  mediastinal]
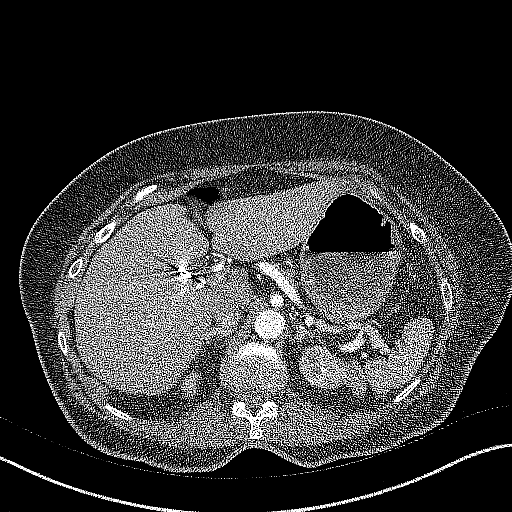
[im 36/154  mediastinal]
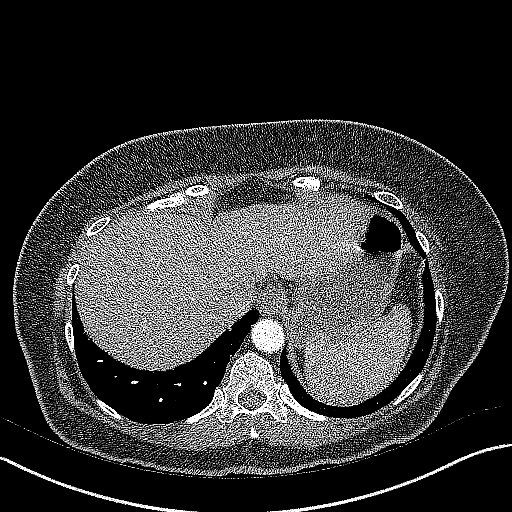
[im 48/154  mediastinal]
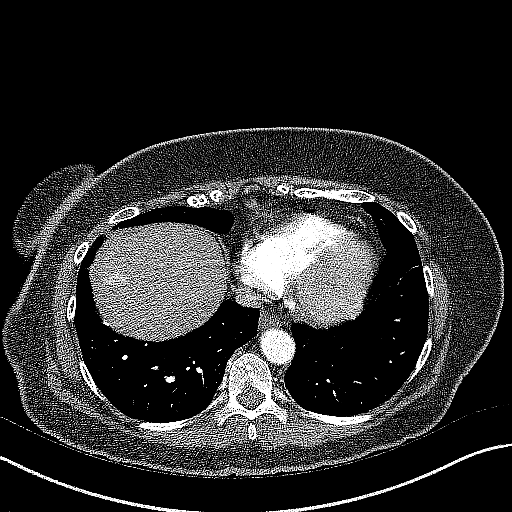
[im 71/154  mediastinal]
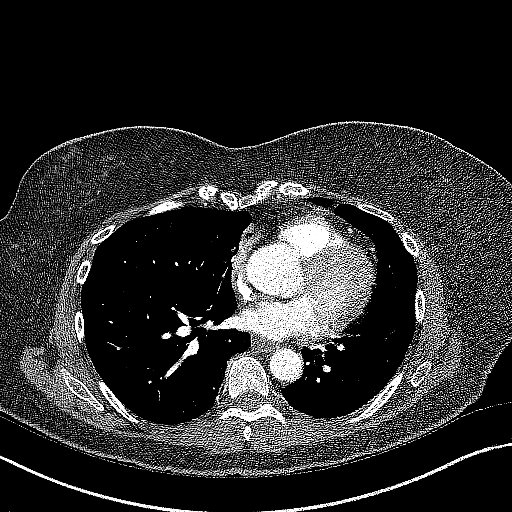
[im 83/154  mediastinal]
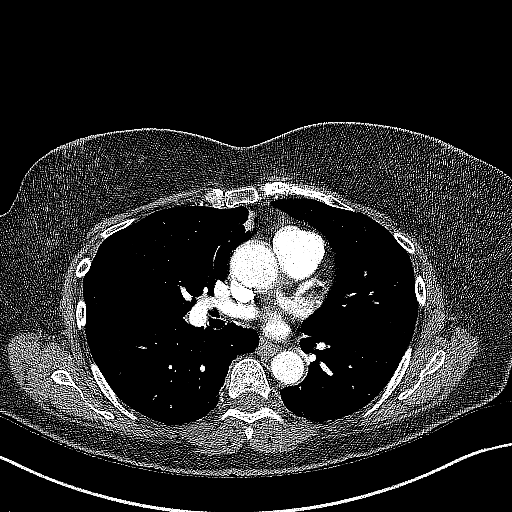
[im 106/154  mediastinal]
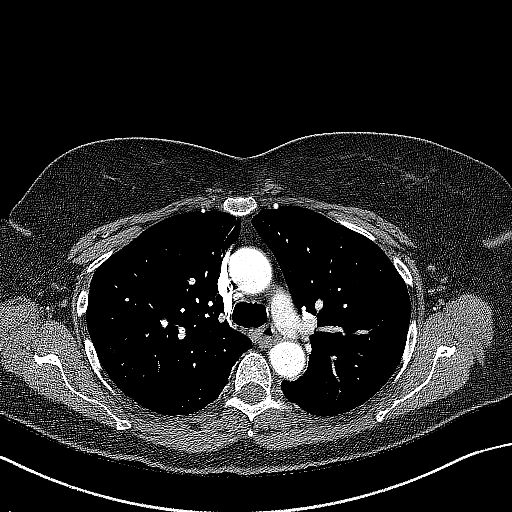
[im 118/154  mediastinal]
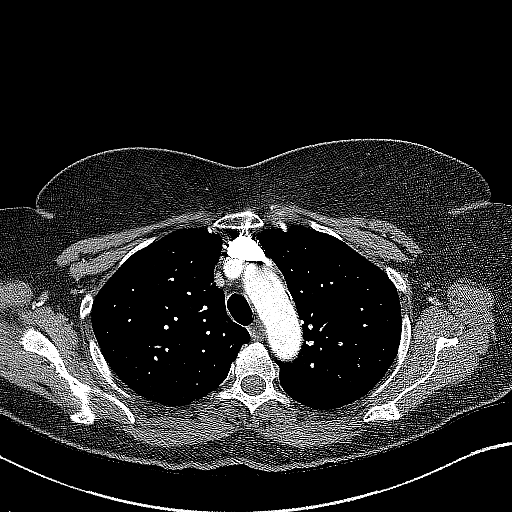
[im 142/154  mediastinal]
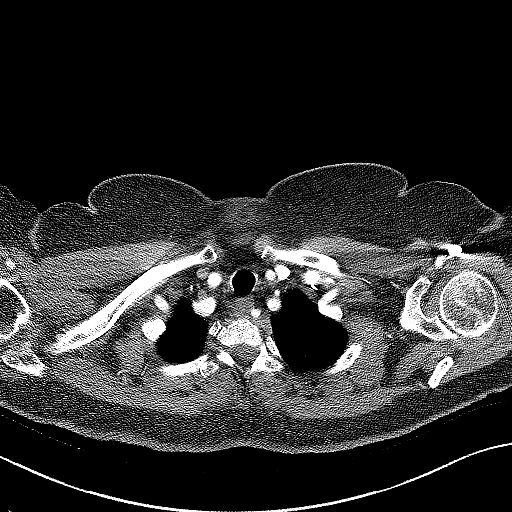

[Series 7: coronals · coronal · 0.62mm/px · 1 of 119 slices shown]
[im 60/119  mediastinal]
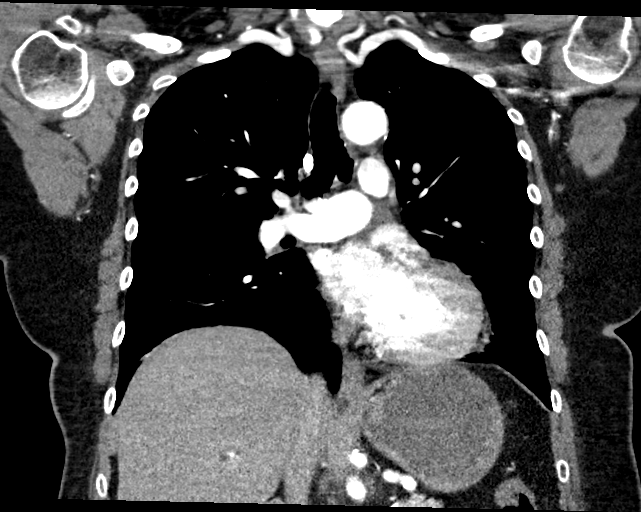

[19 of 36 positions shown; findings below may reference images not displayed]

FINDINGS: CTA CHEST FINDINGS

CARDIOVASCULAR: Thoracic aorta is normal course and caliber. Trace
calcific atherosclerosis. No intrinsic density on noncontrast CT.
Homogeneous contrast opacification of thoracic aorta without
dissection, aneurysm, luminal irregularity, periaortic fluid
collections, or contrast extravasation. Heart size is normal. No
pericardial effusion.

MEDIASTINUM/NODES: No mediastinal mass or lymphadenopathy by CT size
criteria.

LUNGS/PLEURA: Tracheobronchial tree is patent, no pneumothorax. No
pleural effusions, focal consolidations, pulmonary nodules or
masses. Minimal bronchiectasis and scarring RIGHT middle lobe.
Punctate calcified granuloma RIGHT upper lobe.

MUSCULOSKELETAL: Non-suspicious.

UPPER ABDOMEN: Nonacute. Status post cholecystectomy. Postprocedural
CBD dilatation, incompletely imaged.

Review of the MIP images confirms the above findings.
IMPRESSION: 1. Minimal atherosclerosis, less than expected for age without acute
vascular process.
2. No acute cardiopulmonary process.

Aortic Atherosclerosis ([id number]-KPA.A).

## 2019-05-16 ENCOUNTER — Ambulatory Visit: Payer: Medicare Other | Attending: Internal Medicine

## 2019-05-16 DIAGNOSIS — Z23 Encounter for immunization: Secondary | ICD-10-CM | POA: Insufficient documentation

## 2019-05-16 NOTE — Progress Notes (Signed)
   Covid-19 Vaccination Clinic  Name:  Rikia Sukhu    MRN: [id number] DOB: 1940/11/29  05/16/2019  Ms. Tobin was observed post Covid-19 immunization for 15 minutes without incidence. She was provided with Vaccine Information Sheet and instruction to access the V-Safe system.   Ms. Lafave was instructed to call 911 with any severe reactions post vaccine: Marland Kitchen Difficulty breathing  . Swelling of your face and throat  . A fast heartbeat  . A bad rash all over your body  . Dizziness and weakness    Immunizations Administered    Name Date Dose VIS Date Route   Pfizer COVID-19 Vaccine 05/16/2019 12:28 PM 0.3 mL 03/06/2019 Intramuscular   Manufacturer: [employer], [employer]   Lot: [id number]   NDC: [address]-0

## 2019-06-09 ENCOUNTER — Ambulatory Visit: Payer: Medicare Other | Attending: Internal Medicine

## 2019-06-09 DIAGNOSIS — Z23 Encounter for immunization: Secondary | ICD-10-CM

## 2019-06-09 NOTE — Progress Notes (Signed)
   Covid-19 Vaccination Clinic  Name:  Kaitlyn Lewis    MRN: [id number] DOB: [birth year]  06/09/2019  Ms. Kaitlyn Lewis was observed post Covid-19 immunization for 15 minutes without incident. She was provided with Vaccine Information Sheet and instruction to access the V-Safe system.   Ms. Kaitlyn Lewis was instructed to call 911 with any severe reactions post vaccine: Marland Kitchen Difficulty breathing  . Swelling of face and throat  . A fast heartbeat  . A bad rash all over body  . Dizziness and weakness   Immunizations Administered    Name Date Dose VIS Date Route   Pfizer COVID-19 Vaccine 06/09/2019 11:45 AM 0.3 mL 03/06/2019 Intramuscular   Manufacturer: [employer], [employer]   Lot: [id number]   NDC: [address]-6

## 2022-11-11 ENCOUNTER — Other Ambulatory Visit: Payer: Self-pay

## 2022-11-11 ENCOUNTER — Emergency Department
Admission: EM | Admit: 2022-11-11 | Discharge: 2022-11-11 | Disposition: A | Discharge status: Home / Self Care | Payer: 59 | Attending: Emergency Medicine | Admitting: Emergency Medicine

## 2022-11-11 ENCOUNTER — Emergency Department: Payer: 59

## 2022-11-11 DIAGNOSIS — E860 Dehydration: Secondary | ICD-10-CM | POA: Insufficient documentation

## 2022-11-11 DIAGNOSIS — N3001 Acute cystitis with hematuria: Secondary | ICD-10-CM | POA: Diagnosis not present

## 2022-11-11 DIAGNOSIS — R739 Hyperglycemia, unspecified: Secondary | ICD-10-CM

## 2022-11-11 DIAGNOSIS — Z20822 Contact with and (suspected) exposure to covid-19: Secondary | ICD-10-CM | POA: Insufficient documentation

## 2022-11-11 DIAGNOSIS — I10 Essential (primary) hypertension: Secondary | ICD-10-CM | POA: Insufficient documentation

## 2022-11-11 DIAGNOSIS — E1165 Type 2 diabetes mellitus with hyperglycemia: Secondary | ICD-10-CM | POA: Insufficient documentation

## 2022-11-11 DIAGNOSIS — R410 Disorientation, unspecified: Secondary | ICD-10-CM | POA: Insufficient documentation

## 2022-11-11 LAB — CBC
HCT: 36.2 % (ref 36.0–46.0)
Hemoglobin: 10.9 g/dL — ABNORMAL LOW (ref 12.0–15.0)
MCH: 27.5 pg (ref 26.0–34.0)
MCHC: 30.1 g/dL (ref 30.0–36.0)
MCV: 91.2 fL (ref 80.0–100.0)
Platelets: 192 10*3/uL (ref 150–400)
RBC: 3.97 MIL/uL (ref 3.87–5.11)
RDW: 15 % (ref 11.5–15.5)
WBC: 4.5 10*3/uL (ref 4.0–10.5)
nRBC: 0 % (ref 0.0–0.2)

## 2022-11-11 LAB — URINALYSIS, ROUTINE W REFLEX MICROSCOPIC
Bacteria, UA: NONE SEEN
Bilirubin Urine: NEGATIVE
Glucose, UA: 150 mg/dL — AB
Hgb urine dipstick: NEGATIVE
Ketones, ur: NEGATIVE mg/dL
Nitrite: NEGATIVE
Protein, ur: 30 mg/dL — AB
Specific Gravity, Urine: 1.023 (ref 1.005–1.030)
pH: 5 (ref 5.0–8.0)

## 2022-11-11 LAB — BASIC METABOLIC PANEL
Anion gap: 9 (ref 5–15)
BUN: 15 mg/dL (ref 8–23)
CO2: 21 mmol/L — ABNORMAL LOW (ref 22–32)
Calcium: 8.8 mg/dL — ABNORMAL LOW (ref 8.9–10.3)
Chloride: 102 mmol/L (ref 98–111)
Creatinine, Ser: 1.1 mg/dL — ABNORMAL HIGH (ref 0.44–1.00)
GFR, Estimated: 51 mL/min — ABNORMAL LOW (ref >60)
Glucose, Bld: 255 mg/dL — ABNORMAL HIGH (ref 70–99)
Potassium: 4.4 mmol/L (ref 3.5–5.1)
Sodium: 132 mmol/L — ABNORMAL LOW (ref 135–145)

## 2022-11-11 LAB — RESP PANEL BY RT-PCR (RSV, FLU A&B, COVID)  RVPGX2
Influenza A by PCR: NEGATIVE
Influenza B by PCR: NEGATIVE
Resp Syncytial Virus by PCR: NEGATIVE
SARS Coronavirus 2 by RT PCR: NEGATIVE

## 2022-11-11 MED ORDER — SODIUM CHLORIDE 0.9 % IV SOLN
1.0000 g | Freq: Once | INTRAVENOUS | Status: AC
  Administered 2022-11-11: 1 g via INTRAVENOUS
  Filled 2022-11-11: qty 10

## 2022-11-11 MED ORDER — SODIUM CHLORIDE 0.9 % IV BOLUS
1000.0000 mL | Freq: Once | INTRAVENOUS | Status: AC
  Administered 2022-11-11: 1000 mL via INTRAVENOUS

## 2022-11-11 MED ORDER — CEFUROXIME AXETIL 500 MG PO TABS: 500.0000 mg | ORAL_TABLET | Freq: Two times a day (BID) | ORAL | 0 refills | Status: AC

## 2022-11-11 NOTE — ED Notes (Signed)
Patient transported to CT 

## 2022-11-11 NOTE — ED Triage Notes (Signed)
Arrives via [facility] from home.  Called for elevated blood sugar.  BP 82/50 AOx4.  Sat 98% on RA.Patient C/O dark, tea colored urine.  Patient denies any pain.  20g right hand.

## 2022-11-11 NOTE — Discharge Instructions (Signed)
You were seen in the emergency department today for your elevated blood sugar.  There was no evidence of DKA or diabetic ketoacidosis which is a more serious side effect from elevated blood sugars.  There is concern for possible UTI given your urinalysis as well as urinary pain complaints.  I have sent antibiotics to your pharmacy for you to take as prescribed.  Focus on hydration over the next several days.  Please call your PCP and get a follow-up visit within the next week for reassessment and monitoring of your blood sugar levels given that they have been elevated for several days.

## 2022-11-11 NOTE — ED Triage Notes (Signed)
Pt to ED from home [facility] for high BG. Hx DM type 1. CBG was 252 then 280 at home, then 398 with EMS. Pt states has sore throat since 1 week. Pt has HA and feels thirsty. Pt well appearing. Respirations unlabored. Skin warm and dry.

## 2022-11-11 NOTE — ED Provider Notes (Signed)
[facility] Provider Note    Event Date/Time   First MD Initiated Contact with Patient 11/11/22 1429     (approximate)   History   Hyperglycemia   HPI Kaitlyn Lewis is a 82 y.o. female with HTN, DM2, HLD who presents today for hyperglycemia.  Patient presents with daughter who provides additional history.  Notes patient was having elevated blood sugars earlier today in the 200s and 300s which is atypical for her.  She also noted 1 episode where she seemed slightly lightheaded and lethargic which lasted very briefly before resolution.  Separately, patient has had some urinary pain symptoms as well along with sore throat over the past week.  At time of interview, patient was denying chest pain, shortness of breath, abdominal pain, nausea, vomiting, constipation, diarrhea, leg swelling.  She does admit to poor p.o. intake over the past several days.     Physical Exam   Triage Vital Signs: ED Triage Vitals  Encounter Vitals Group     BP 11/11/22 1241 94/63     Systolic BP Percentile --      Diastolic BP Percentile --      Pulse Rate 11/11/22 1241 70     Resp 11/11/22 1241 20     Temp 11/11/22 1241 (!) 97.1 F (36.2 C)     Temp Source 11/11/22 1241 Oral     SpO2 11/11/22 1241 98 %     Weight 11/11/22 1244 175 lb (79.4 kg)     Height 11/11/22 1244 5\' 3"  (1.6 m)     Head Circumference --      Peak Flow --      Pain Score 11/11/22 1239 3     Pain Loc --      Pain Education --      Exclude from Growth Chart --     Most recent vital signs: Vitals:   11/11/22 1241 11/11/22 1500  BP: 94/63 109/61  Pulse: 70   Resp: 20   Temp: (!) 97.1 F (36.2 C)   SpO2: 98%    Physical Exam: I have reviewed the vital signs and nursing notes. General: Awake, alert, no acute distress.  Nontoxic appearing. Head:  Atraumatic, normocephalic.   ENT:  EOM intact, PERRL. Oral mucosa is pink and moist with no lesions. Neck: Neck is supple with full range of  motion, No meningeal signs. Cardiovascular:  RRR, No murmurs. Peripheral pulses palpable and equal bilaterally. Respiratory:  Symmetrical chest wall expansion.  No rhonchi, rales, or wheezes.  Good air movement throughout.  No use of accessory muscles.   Musculoskeletal:  No cyanosis or edema. Moving extremities with full ROM Abdomen:  Soft, nontender, nondistended. Neuro:  GCS 15, moving all four extremities, interacting appropriately. Speech clear.  Cranial nerves II through X intact.  No weakness deficits throughout all extremities.  Sensation intact and equal bilaterally throughout all extremities. Psych:  Calm, appropriate.   Skin:  Warm, dry, no rash.     ED Results / Procedures / Treatments   Labs (all labs ordered are listed, but only abnormal results are displayed) Labs Reviewed  BASIC METABOLIC PANEL - Abnormal; Notable for the following components:      Result Value   Sodium 132 (*)    CO2 21 (*)    Glucose, Bld 255 (*)    Creatinine, Ser 1.10 (*)    Calcium 8.8 (*)    GFR, Estimated 51 (*)    All other components within normal  limits  CBC - Abnormal; Notable for the following components:   Hemoglobin 10.9 (*)    All other components within normal limits  URINALYSIS, ROUTINE W REFLEX MICROSCOPIC - Abnormal; Notable for the following components:   Color, Urine YELLOW (*)    APPearance HAZY (*)    Glucose, UA 150 (*)    Protein, ur 30 (*)    Leukocytes,Ua MODERATE (*)    All other components within normal limits  RESP PANEL BY RT-PCR (RSV, FLU A&B, COVID)  RVPGX2  CBG MONITORING, ED  CBG MONITORING, ED     EKG    RADIOLOGY CT imaging of head per my interpretation shows no acute abnormalities   PROCEDURES:  Critical Care performed: No  Procedures   MEDICATIONS ORDERED IN ED: Medications  sodium chloride 0.9 % bolus 1,000 mL (1,000 mLs Intravenous New Bag/Given 11/11/22 1454)  cefTRIAXone (ROCEPHIN) 1 g in sodium chloride 0.9 % 100 mL IVPB (1 g  Intravenous New Bag/Given 11/11/22 1523)     IMPRESSION / MDM / ASSESSMENT AND PLAN / ED COURSE  I reviewed the triage vital signs and the nursing notes.                              Differential diagnosis includes, but is not limited to, hyperglycemia, DKA, UTI, dehydration.  Patient's presentation is most consistent with acute presentation with potential threat to life or bodily function.  Patient is an 82 year old female presenting today for hyperglycemia.  No evidence of DKA on labs with no anion gap acidosis and no ketones in the urine.  She does have dysuria symptoms and although UA does not have bacteria it is positive for leukocytes as well as WBCs and we will treat for possible UTI at this time.  Labs otherwise indicate dehydration patient was given 1 L fluids.  Asymptomatic throughout her entire visit here in the emergency department.  Patient stable for discharge with outpatient follow-up with her PCP.  They are given strict return precautions for worsening symptoms.  The patient is on the cardiac monitor to evaluate for evidence of arrhythmia and/or significant heart rate changes. Clinical Course as of 11/11/22 1636  Sun Nov 11, 2022  1444 Basic metabolic panel(!) Mild hyperglycemia with slightly low CO2 but otherwise no anion gap to suggest DKA. [DW]  1445 Urinalysis, Routine w reflex microscopic -Urine, Clean Catch(!) Positive WBC and moderate leuk esterase although no bacteria seen on otherwise a clean UA.  Will correlate with symptoms of patient. [DW]  1445 CBC(!) unremarkable [DW]  1516 Patient does note dysuria symptoms.  With positive leukocyte esterase and elevated WBCs, will treat potential urinary tract infection.  Otherwise no ketones seen as well to suggest DKA at this time. [DW]  1547 Negative viral respiratory panel [DW]  1623 CT Head Wo Contrast No acute abnormalities per my interpretation. [DW]  1632 Reassessed patient.  Asymptomatic at this time.  Blood  pressure normalized right now.  Patient is wanting to go home without further workup at this time.  I do feel she is safe for discharge for UTI in the absence of other symptoms. [DW]    Clinical Course User Index [DW] Janith Lima, MD     FINAL CLINICAL IMPRESSION(S) / ED DIAGNOSES   Final diagnoses:  Acute cystitis with hematuria  Hyperglycemia  Dehydration     Rx / DC Orders   ED Discharge Orders  Ordered    cefUROXime (CEFTIN) 500 MG tablet  2 times daily with meals        11/11/22 1635             Note:  This document was prepared using Dragon voice recognition software and may include unintentional dictation errors.   Janith Lima, MD 11/11/22 [phone]
